# Patient Record
Sex: Female | Born: 1935 | Hispanic: No | Marital: Married
Health system: Southern US, Community
[De-identification: ages and names within clinical notes are randomized; demographics above are authoritative.]

## PROBLEM LIST (undated history)

## (undated) DIAGNOSIS — Z8659 Personal history of other mental and behavioral disorders: Secondary | ICD-10-CM

## (undated) DIAGNOSIS — I10 Essential (primary) hypertension: Secondary | ICD-10-CM

## (undated) DIAGNOSIS — K219 Gastro-esophageal reflux disease without esophagitis: Secondary | ICD-10-CM

## (undated) DIAGNOSIS — E7800 Pure hypercholesterolemia, unspecified: Secondary | ICD-10-CM

## (undated) DIAGNOSIS — I82409 Acute embolism and thrombosis of unspecified deep veins of unspecified lower extremity: Secondary | ICD-10-CM

## (undated) DIAGNOSIS — C50919 Malignant neoplasm of unspecified site of unspecified female breast: Secondary | ICD-10-CM

## (undated) HISTORY — DX: Personal history of other mental and behavioral disorders: Z86.59

## (undated) HISTORY — PX: CHOLECYSTECTOMY: SHX55

## (undated) HISTORY — DX: Essential (primary) hypertension: I10

## (undated) HISTORY — DX: Pure hypercholesterolemia, unspecified: E78.00

## (undated) HISTORY — PX: OTHER SURGICAL HISTORY: SHX169

## (undated) HISTORY — PX: BREAST BIOPSY: SHX20

## (undated) HISTORY — PX: APPENDECTOMY: SHX54

## (undated) HISTORY — DX: Gastro-esophageal reflux disease without esophagitis: K21.9

## (undated) HISTORY — DX: Acute embolism and thrombosis of unspecified deep veins of unspecified lower extremity: I82.409

## (undated) HISTORY — PX: BREAST CYST ASPIRATION: SHX578

---

## 2004-02-27 ENCOUNTER — Ambulatory Visit: Payer: Self-pay | Admitting: Family Medicine

## 2005-04-22 ENCOUNTER — Ambulatory Visit: Payer: Self-pay | Admitting: Family Medicine

## 2005-06-25 ENCOUNTER — Ambulatory Visit: Payer: Self-pay | Admitting: General Surgery

## 2006-05-26 ENCOUNTER — Ambulatory Visit: Payer: Self-pay | Admitting: Family Medicine

## 2007-06-21 ENCOUNTER — Ambulatory Visit: Payer: Self-pay | Admitting: Family Medicine

## 2008-06-21 ENCOUNTER — Ambulatory Visit: Payer: Self-pay | Admitting: Internal Medicine

## 2009-01-13 ENCOUNTER — Ambulatory Visit: Payer: Self-pay | Admitting: Internal Medicine

## 2009-04-19 HISTORY — PX: MASTECTOMY: SHX3

## 2009-06-23 ENCOUNTER — Ambulatory Visit: Payer: Self-pay | Admitting: Internal Medicine

## 2009-07-22 ENCOUNTER — Ambulatory Visit: Payer: Self-pay | Admitting: Surgery

## 2009-07-31 ENCOUNTER — Ambulatory Visit: Payer: Self-pay | Admitting: Surgery

## 2009-08-13 ENCOUNTER — Ambulatory Visit: Payer: Self-pay | Admitting: Internal Medicine

## 2009-08-13 ENCOUNTER — Ambulatory Visit: Payer: Self-pay | Admitting: Cardiology

## 2009-08-19 ENCOUNTER — Ambulatory Visit: Payer: Self-pay | Admitting: Surgery

## 2009-09-17 ENCOUNTER — Ambulatory Visit: Payer: Self-pay | Admitting: Oncology

## 2009-09-17 ENCOUNTER — Ambulatory Visit: Payer: Self-pay | Admitting: Surgery

## 2009-09-23 ENCOUNTER — Inpatient Hospital Stay: Payer: Self-pay | Admitting: Internal Medicine

## 2009-10-15 ENCOUNTER — Ambulatory Visit: Payer: Self-pay | Admitting: Oncology

## 2009-10-17 ENCOUNTER — Ambulatory Visit: Payer: Self-pay | Admitting: Oncology

## 2009-10-21 ENCOUNTER — Ambulatory Visit: Payer: Self-pay | Admitting: Oncology

## 2009-10-23 ENCOUNTER — Ambulatory Visit: Payer: Self-pay | Admitting: Oncology

## 2009-11-03 ENCOUNTER — Ambulatory Visit: Payer: Self-pay | Admitting: Surgery

## 2009-11-06 ENCOUNTER — Ambulatory Visit: Payer: Self-pay | Admitting: Surgery

## 2009-11-11 ENCOUNTER — Ambulatory Visit: Payer: Self-pay | Admitting: Oncology

## 2009-11-15 ENCOUNTER — Other Ambulatory Visit: Payer: Self-pay | Admitting: Internal Medicine

## 2009-11-17 ENCOUNTER — Ambulatory Visit: Payer: Self-pay | Admitting: Oncology

## 2009-12-18 ENCOUNTER — Ambulatory Visit: Payer: Self-pay | Admitting: Oncology

## 2010-01-02 ENCOUNTER — Ambulatory Visit: Payer: Self-pay | Admitting: General Practice

## 2010-01-17 ENCOUNTER — Ambulatory Visit: Payer: Self-pay | Admitting: Oncology

## 2010-02-17 ENCOUNTER — Ambulatory Visit: Payer: Self-pay | Admitting: Oncology

## 2010-03-19 ENCOUNTER — Ambulatory Visit: Payer: Self-pay | Admitting: Oncology

## 2010-04-19 ENCOUNTER — Ambulatory Visit: Payer: Self-pay | Admitting: Oncology

## 2010-05-20 ENCOUNTER — Ambulatory Visit: Payer: Self-pay | Admitting: Oncology

## 2010-06-18 ENCOUNTER — Ambulatory Visit: Payer: Self-pay | Admitting: Oncology

## 2010-07-22 ENCOUNTER — Ambulatory Visit: Payer: Self-pay | Admitting: Oncology

## 2010-08-11 ENCOUNTER — Ambulatory Visit: Payer: Self-pay | Admitting: Internal Medicine

## 2010-08-18 ENCOUNTER — Ambulatory Visit: Payer: Self-pay | Admitting: Oncology

## 2010-09-18 ENCOUNTER — Ambulatory Visit: Payer: Self-pay | Admitting: Oncology

## 2010-10-18 ENCOUNTER — Ambulatory Visit: Payer: Self-pay | Admitting: Oncology

## 2010-11-18 ENCOUNTER — Ambulatory Visit: Payer: Self-pay | Admitting: Oncology

## 2010-12-11 LAB — CANCER ANTIGEN 27.29: CA 27.29: 19.2 U/mL (ref 0.0–38.6)

## 2010-12-19 ENCOUNTER — Ambulatory Visit: Payer: Self-pay

## 2010-12-19 ENCOUNTER — Ambulatory Visit: Payer: Self-pay | Admitting: Oncology

## 2011-02-08 ENCOUNTER — Ambulatory Visit: Payer: Self-pay | Admitting: Oncology

## 2011-02-18 ENCOUNTER — Ambulatory Visit: Payer: Self-pay | Admitting: Oncology

## 2011-03-16 LAB — CANCER ANTIGEN 27.29: CA 27.29: 18.4 U/mL (ref 0.0–38.6)

## 2011-03-20 ENCOUNTER — Ambulatory Visit: Payer: Self-pay | Admitting: Oncology

## 2011-05-19 ENCOUNTER — Ambulatory Visit: Payer: Self-pay | Admitting: Oncology

## 2011-05-21 ENCOUNTER — Ambulatory Visit: Payer: Self-pay | Admitting: Oncology

## 2011-06-18 ENCOUNTER — Ambulatory Visit: Payer: Self-pay | Admitting: Oncology

## 2011-09-14 ENCOUNTER — Ambulatory Visit: Payer: Self-pay | Admitting: Oncology

## 2011-09-15 ENCOUNTER — Ambulatory Visit: Payer: Self-pay | Admitting: Oncology

## 2011-09-18 ENCOUNTER — Ambulatory Visit: Payer: Self-pay | Admitting: Oncology

## 2011-10-18 ENCOUNTER — Ambulatory Visit: Payer: Self-pay | Admitting: Oncology

## 2011-12-16 ENCOUNTER — Ambulatory Visit: Payer: Self-pay | Admitting: Oncology

## 2011-12-19 ENCOUNTER — Ambulatory Visit: Payer: Self-pay | Admitting: Oncology

## 2012-04-19 ENCOUNTER — Ambulatory Visit: Payer: Self-pay | Admitting: Radiation Oncology

## 2012-05-20 ENCOUNTER — Ambulatory Visit: Payer: Self-pay | Admitting: Radiation Oncology

## 2012-07-12 ENCOUNTER — Ambulatory Visit: Payer: Self-pay | Admitting: Oncology

## 2012-07-18 ENCOUNTER — Ambulatory Visit: Payer: Self-pay | Admitting: Oncology

## 2012-09-18 ENCOUNTER — Ambulatory Visit: Payer: Self-pay | Admitting: Internal Medicine

## 2012-10-02 ENCOUNTER — Ambulatory Visit: Payer: Self-pay | Admitting: Oncology

## 2013-01-17 ENCOUNTER — Ambulatory Visit: Payer: Self-pay | Admitting: Oncology

## 2013-01-18 LAB — CANCER ANTIGEN 27.29: CA 27.29: 26.3 U/mL (ref 0.0–38.6)

## 2013-01-24 ENCOUNTER — Ambulatory Visit: Payer: Self-pay | Admitting: Ophthalmology

## 2013-02-17 ENCOUNTER — Ambulatory Visit: Payer: Self-pay | Admitting: Oncology

## 2013-02-28 ENCOUNTER — Ambulatory Visit: Payer: Self-pay | Admitting: Ophthalmology

## 2013-07-17 ENCOUNTER — Ambulatory Visit: Payer: Self-pay | Admitting: Oncology

## 2013-08-15 ENCOUNTER — Ambulatory Visit: Payer: Self-pay | Admitting: Oncology

## 2013-08-16 LAB — CANCER ANTIGEN 27.29: CA 27.29: 31.5 U/mL (ref 0.0–38.6)

## 2013-08-17 ENCOUNTER — Ambulatory Visit: Payer: Self-pay | Admitting: Oncology

## 2013-09-19 ENCOUNTER — Ambulatory Visit: Payer: Self-pay | Admitting: Oncology

## 2014-02-13 ENCOUNTER — Ambulatory Visit: Payer: Self-pay | Admitting: Oncology

## 2014-02-14 LAB — CANCER ANTIGEN 27.29: CA 27.29: 51.9 U/mL — AB (ref 0.0–38.6)

## 2014-02-17 ENCOUNTER — Ambulatory Visit: Payer: Self-pay | Admitting: Oncology

## 2014-03-27 ENCOUNTER — Ambulatory Visit: Payer: Self-pay | Admitting: Oncology

## 2014-03-28 LAB — CANCER ANTIGEN 27.29: CA 27.29: 55.3 U/mL — AB (ref 0.0–38.6)

## 2014-04-19 ENCOUNTER — Ambulatory Visit: Payer: Self-pay | Admitting: Oncology

## 2014-08-09 ENCOUNTER — Ambulatory Visit
Admit: 2014-08-09 | Disposition: A | Discharge status: Home / Self Care | Payer: Self-pay | Attending: Oncology | Admitting: Oncology

## 2014-08-10 LAB — CANCER ANTIGEN 27.29: CA 27.29: 82.9 U/mL — ABNORMAL HIGH (ref 0.0–38.6)

## 2014-09-06 ENCOUNTER — Other Ambulatory Visit: Payer: Self-pay | Admitting: Oncology

## 2014-09-06 DIAGNOSIS — C50911 Malignant neoplasm of unspecified site of right female breast: Secondary | ICD-10-CM

## 2014-09-06 DIAGNOSIS — C50919 Malignant neoplasm of unspecified site of unspecified female breast: Secondary | ICD-10-CM | POA: Insufficient documentation

## 2014-11-15 ENCOUNTER — Other Ambulatory Visit: Payer: Self-pay | Admitting: Oncology

## 2014-11-15 DIAGNOSIS — C50911 Malignant neoplasm of unspecified site of right female breast: Secondary | ICD-10-CM

## 2014-11-18 ENCOUNTER — Telehealth: Payer: Self-pay | Admitting: Oncology

## 2014-11-18 ENCOUNTER — Other Ambulatory Visit: Payer: Self-pay | Admitting: *Deleted

## 2014-11-18 DIAGNOSIS — C50911 Malignant neoplasm of unspecified site of right female breast: Secondary | ICD-10-CM

## 2014-11-18 NOTE — Telephone Encounter (Signed)
Order entered for screening mammogram

## 2014-11-18 NOTE — Telephone Encounter (Signed)
Patient called to get mammogram appt. When I contacted [facility] to schedule, they said the order needs to be changed to screening only and the ultrasound order needs to be cancelled. Please change order and let me know when I can call [facility] to schedule. Thanks.

## 2014-11-26 ENCOUNTER — Ambulatory Visit
Admission: RE | Admit: 2014-11-26 | Discharge: 2014-11-26 | Disposition: A | Discharge status: Home / Self Care | Payer: Medicare Other | Source: Ambulatory Visit | Attending: Oncology | Admitting: Oncology

## 2014-11-26 DIAGNOSIS — Z1231 Encounter for screening mammogram for malignant neoplasm of breast: Secondary | ICD-10-CM | POA: Diagnosis present

## 2014-11-26 DIAGNOSIS — Z853 Personal history of malignant neoplasm of breast: Secondary | ICD-10-CM | POA: Diagnosis present

## 2014-11-26 DIAGNOSIS — C50911 Malignant neoplasm of unspecified site of right female breast: Secondary | ICD-10-CM

## 2014-11-26 DIAGNOSIS — M8580 Other specified disorders of bone density and structure, unspecified site: Secondary | ICD-10-CM | POA: Diagnosis not present

## 2014-11-26 HISTORY — DX: Malignant neoplasm of unspecified site of unspecified female breast: C50.919

## 2014-12-12 ENCOUNTER — Other Ambulatory Visit: Payer: Self-pay | Admitting: Ophthalmology

## 2014-12-12 DIAGNOSIS — H532 Diplopia: Secondary | ICD-10-CM

## 2014-12-16 ENCOUNTER — Ambulatory Visit
Admission: RE | Admit: 2014-12-16 | Discharge: 2014-12-16 | Disposition: A | Discharge status: Home / Self Care | Payer: Medicare Other | Source: Ambulatory Visit | Attending: Ophthalmology | Admitting: Ophthalmology

## 2014-12-16 DIAGNOSIS — H532 Diplopia: Secondary | ICD-10-CM | POA: Insufficient documentation

## 2014-12-16 DIAGNOSIS — C50919 Malignant neoplasm of unspecified site of unspecified female breast: Secondary | ICD-10-CM | POA: Diagnosis not present

## 2014-12-16 MED ORDER — GADOBENATE DIMEGLUMINE 529 MG/ML IV SOLN
15.0000 mL | Freq: Once | INTRAVENOUS | Status: AC | PRN
  Administered 2014-12-16: 13 mL via INTRAVENOUS

## 2014-12-17 ENCOUNTER — Inpatient Hospital Stay: Payer: Medicare Other | Attending: Oncology | Admitting: Oncology

## 2014-12-17 ENCOUNTER — Encounter: Payer: Self-pay | Admitting: Oncology

## 2014-12-17 ENCOUNTER — Ambulatory Visit
Admission: RE | Admit: 2014-12-17 | Discharge: 2014-12-17 | Disposition: A | Discharge status: Home / Self Care | Payer: Medicare Other | Source: Ambulatory Visit | Attending: Radiation Oncology | Admitting: Radiation Oncology

## 2014-12-17 ENCOUNTER — Telehealth: Payer: Self-pay | Admitting: *Deleted

## 2014-12-17 VITALS — BP 153/69 | HR 65 | Temp 97.4°F | Resp 18 | Wt 132.5 lb

## 2014-12-17 DIAGNOSIS — C7951 Secondary malignant neoplasm of bone: Secondary | ICD-10-CM | POA: Diagnosis not present

## 2014-12-17 DIAGNOSIS — R918 Other nonspecific abnormal finding of lung field: Secondary | ICD-10-CM | POA: Diagnosis not present

## 2014-12-17 DIAGNOSIS — K219 Gastro-esophageal reflux disease without esophagitis: Secondary | ICD-10-CM | POA: Diagnosis not present

## 2014-12-17 DIAGNOSIS — H538 Other visual disturbances: Secondary | ICD-10-CM | POA: Diagnosis not present

## 2014-12-17 DIAGNOSIS — Z170 Estrogen receptor positive status [ER+]: Secondary | ICD-10-CM | POA: Insufficient documentation

## 2014-12-17 DIAGNOSIS — C50919 Malignant neoplasm of unspecified site of unspecified female breast: Secondary | ICD-10-CM | POA: Diagnosis not present

## 2014-12-17 DIAGNOSIS — C50911 Malignant neoplasm of unspecified site of right female breast: Secondary | ICD-10-CM | POA: Insufficient documentation

## 2014-12-17 DIAGNOSIS — E780 Pure hypercholesterolemia: Secondary | ICD-10-CM | POA: Diagnosis not present

## 2014-12-17 DIAGNOSIS — C7931 Secondary malignant neoplasm of brain: Secondary | ICD-10-CM | POA: Insufficient documentation

## 2014-12-17 DIAGNOSIS — Z803 Family history of malignant neoplasm of breast: Secondary | ICD-10-CM | POA: Insufficient documentation

## 2014-12-17 DIAGNOSIS — F418 Other specified anxiety disorders: Secondary | ICD-10-CM | POA: Insufficient documentation

## 2014-12-17 DIAGNOSIS — H532 Diplopia: Secondary | ICD-10-CM | POA: Diagnosis not present

## 2014-12-17 DIAGNOSIS — G939 Disorder of brain, unspecified: Secondary | ICD-10-CM | POA: Insufficient documentation

## 2014-12-17 DIAGNOSIS — Z79899 Other long term (current) drug therapy: Secondary | ICD-10-CM | POA: Insufficient documentation

## 2014-12-17 DIAGNOSIS — R2689 Other abnormalities of gait and mobility: Secondary | ICD-10-CM | POA: Insufficient documentation

## 2014-12-17 DIAGNOSIS — Z86718 Personal history of other venous thrombosis and embolism: Secondary | ICD-10-CM | POA: Insufficient documentation

## 2014-12-17 DIAGNOSIS — I10 Essential (primary) hypertension: Secondary | ICD-10-CM | POA: Insufficient documentation

## 2014-12-17 DIAGNOSIS — Z9011 Acquired absence of right breast and nipple: Secondary | ICD-10-CM | POA: Diagnosis not present

## 2014-12-17 DIAGNOSIS — Z79811 Long term (current) use of aromatase inhibitors: Secondary | ICD-10-CM | POA: Diagnosis not present

## 2014-12-17 DIAGNOSIS — Z510 Encounter for antineoplastic radiation therapy: Secondary | ICD-10-CM | POA: Insufficient documentation

## 2014-12-17 MED ORDER — DEXAMETHASONE 4 MG PO TABS: 8.0000 mg | ORAL_TABLET | Freq: Two times a day (BID) | ORAL | Status: DC

## 2014-12-17 NOTE — Consult Note (Signed)
Except an outstanding is perfect of Radiation Oncology NEW PATIENT EVALUATION  Name: Breanna Curtis  MRN: [id number]  Date:   12/17/2014     DOB: 07-19-1933   This 81 y.o. female patient presents to the clinic for initial evaluation of brain metastases.  REFERRING PHYSICIAN: Kirk Ruths, MD  CHIEF COMPLAINT: No chief complaint on file.   DIAGNOSIS: The encounter diagnosis was Metastatic cancer to brain.   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 She received adjuvant chest wall and peripheral lymphatic radiation. Tumor was borderline ER positive PR positive and HER-2/neu negative. She recently was seen by ophthalmology for diplopia and MRI scan was recommended based on possible extrinsic compression on the posterior orbits. MRI scan showed findings consistent with widespread metastatic disease involving both the calvarium as well as the brain. There is also infiltration of tumor into the orbital musculature compressing the optic nerves bilaterally. This was all compatible with breast cancer metastatic to the brain. She is seen today for evaluation and consideration of whole brain radiation. She does have some periorbital swelling. She's also having some slight dizziness although is able to ambulate fairly well.  PLANNED TREATMENT REGIMEN: Palliative whole brain radiation  PAST MEDICAL HISTORY:  has a past medical history of Breast cancer.    PAST SURGICAL HISTORY:  Past Surgical History  Procedure Laterality Date  . Mastectomy Right 2011    rt mastectomy-chemo/rad  . Breast cyst aspiration Left     lt fna-neg  . Breast biopsy Left     neg    FAMILY HISTORY:  family history includes Breast cancer (age of onset: 15) in her maternal aunt.  SOCIAL HISTORY:    ALLERGIES: Review of patient's allergies indicates not on file.  MEDICATIONS:  No current outpatient prescriptions on file.   No current facility-administered medications for this encounter.    ECOG PERFORMANCE STATUS:  1 - Symptomatic but completely ambulatory  REVIEW OF SYSTEMS: Except for the dizziness, diplopia and periorbital swelling Patient denies any weight loss, fatigue, weakness, fever, chills or night sweats. Patient denies any loss of vision, blurred vision. Patient denies any ringing  of the ears or hearing loss. No irregular heartbeat. Patient denies heart murmur or history of fainting. Patient denies any chest pain or pain radiating to her upper extremities. Patient denies any shortness of breath, difficulty breathing at night, cough or hemoptysis. Patient denies any swelling in the lower legs. Patient denies any nausea vomiting, vomiting of blood, or coffee ground material in the vomitus. Patient denies any stomach pain. Patient states has had normal bowel movements no significant constipation or diarrhea. Patient denies any dysuria, hematuria or significant nocturia. Patient denies any problems walking, swelling in the joints or loss of balance. Patient denies any skin changes, loss of hair or loss of weight. Patient denies any excessive worrying or anxiety or significant depression. Patient denies any problems with insomnia. Patient denies excessive thirst, polyuria, polydipsia. Patient denies any swollen glands, patient denies easy bruising or easy bleeding. Patient denies any recent infections, allergies or URI. Patient "s visual fields have not changed significantly in recent time.    PHYSICAL EXAM: There were no vitals taken for this visit. Well-developed elderly female in NAD.  Crude visual fields are within normal range. Extraocular motion is intact. No focal motor sensory or  DTR levels. Cranial nerves II through XII appear grossly intact. She is status post right modified radical mastectomy chest walls free of any evidence of recurrent disease. Well-developed well-nourished patient in NAD. HEENT reveals PERLA, EOMI, discs not visualized.  Oral cavity is clear. No oral mucosal lesions are identified. Neck is clear without evidence of cervical or supraclavicular adenopathy. Lungs are clear to A&P. Cardiac examination is essentially unremarkable with regular rate and rhythm without murmur rub or thrill. Abdomen is benign with no organomegaly or masses noted. Motor sensory and DTR levels are equal and symmetric in the upper and lower extremities. Cranial nerves II through XII are grossly intact. Proprioception is intact. No peripheral adenopathy or edema is identified. No motor or sensory levels are noted. Crude visual fields are within normal range.   LABORATORY DATA: No current pathology reports reviewed    RADIOLOGY RESULTS: MRI scans are reviewed  IMPRESSION: Stage IV breast cancer with metastatic disease to the brain including posterior orbits bilaterally.  PLAN: At this time I to go ahead with whole brain radiation therapy. Would plan on delivering 3000 cGy in 10 fractions. I discussed the risks and benefits of treatment including possible cognitive decline, fatigue, alteration of blood counts, loss of hair and irritation of the scalp, patient and her daughter both seem to comprehend my treatment plan well. I've set up and ordered CT simulation for tomorrow. I've also asked medical oncology to place her on styloids. Also would probably do a bone scan or other scans to complete her metastatic workup. I discussed the case personally with medical oncology.  I would like to take this opportunity for allowing me to participate in the care of your patient.Armstead Peaks., MD

## 2014-12-17 NOTE — Telephone Encounter (Signed)
Went to eye doc who ordered MRI of eyes and she has masses pushing on her optic nerves, Asking if her appt can be moved up. Appt for today at [facility] given

## 2014-12-17 NOTE — Progress Notes (Signed)
Patient was seen by opthalmology for diplopia and received a brain MRI that showed brain mets.

## 2014-12-18 ENCOUNTER — Ambulatory Visit
Admission: RE | Admit: 2014-12-18 | Discharge: 2014-12-18 | Disposition: A | Discharge status: Home / Self Care | Payer: Medicare Other | Source: Ambulatory Visit | Attending: Radiation Oncology | Admitting: Radiation Oncology

## 2014-12-18 DIAGNOSIS — Z510 Encounter for antineoplastic radiation therapy: Secondary | ICD-10-CM | POA: Diagnosis present

## 2014-12-18 DIAGNOSIS — C50919 Malignant neoplasm of unspecified site of unspecified female breast: Secondary | ICD-10-CM | POA: Diagnosis not present

## 2014-12-18 DIAGNOSIS — Z170 Estrogen receptor positive status [ER+]: Secondary | ICD-10-CM | POA: Diagnosis not present

## 2014-12-18 DIAGNOSIS — C7931 Secondary malignant neoplasm of brain: Secondary | ICD-10-CM | POA: Diagnosis not present

## 2014-12-19 ENCOUNTER — Ambulatory Visit
Admission: RE | Admit: 2014-12-19 | Discharge: 2014-12-19 | Disposition: A | Discharge status: Home / Self Care | Payer: Medicare Other | Source: Ambulatory Visit | Attending: Oncology | Admitting: Oncology

## 2014-12-19 DIAGNOSIS — C50919 Malignant neoplasm of unspecified site of unspecified female breast: Secondary | ICD-10-CM | POA: Diagnosis present

## 2014-12-19 DIAGNOSIS — C7931 Secondary malignant neoplasm of brain: Secondary | ICD-10-CM | POA: Diagnosis present

## 2014-12-19 MED ORDER — TECHNETIUM TC 99M MEDRONATE IV KIT
25.0000 | PACK | Freq: Once | INTRAVENOUS | Status: AC | PRN
  Administered 2014-12-19: 23.26 via INTRAVENOUS

## 2014-12-20 ENCOUNTER — Other Ambulatory Visit: Payer: Self-pay

## 2014-12-20 ENCOUNTER — Ambulatory Visit: Payer: Self-pay | Admitting: Oncology

## 2014-12-20 ENCOUNTER — Ambulatory Visit
Admission: RE | Admit: 2014-12-20 | Discharge: 2014-12-20 | Disposition: A | Discharge status: Home / Self Care | Payer: Medicare Other | Source: Ambulatory Visit | Attending: Oncology | Admitting: Oncology

## 2014-12-20 DIAGNOSIS — C50919 Malignant neoplasm of unspecified site of unspecified female breast: Secondary | ICD-10-CM | POA: Insufficient documentation

## 2014-12-20 DIAGNOSIS — I517 Cardiomegaly: Secondary | ICD-10-CM | POA: Insufficient documentation

## 2014-12-20 DIAGNOSIS — C7931 Secondary malignant neoplasm of brain: Secondary | ICD-10-CM | POA: Diagnosis present

## 2014-12-20 DIAGNOSIS — C7951 Secondary malignant neoplasm of bone: Secondary | ICD-10-CM | POA: Insufficient documentation

## 2014-12-20 DIAGNOSIS — X58XXXA Exposure to other specified factors, initial encounter: Secondary | ICD-10-CM | POA: Diagnosis not present

## 2014-12-20 DIAGNOSIS — M4856XA Collapsed vertebra, not elsewhere classified, lumbar region, initial encounter for fracture: Secondary | ICD-10-CM | POA: Diagnosis not present

## 2014-12-20 MED ORDER — IOHEXOL 300 MG/ML  SOLN
100.0000 mL | Freq: Once | INTRAMUSCULAR | Status: AC | PRN
  Administered 2014-12-20: 100 mL via INTRAVENOUS

## 2014-12-22 NOTE — Progress Notes (Signed)
[place]  [phone] [phone]  ID: Breanna Curtis OB: 13-Feb-1934  MR#: [id number]  UUV#:[id number]  Patient Care Team: Kirk Ruths, MD as PCP - General (Internal Medicine)  CHIEF COMPLAINT:  Chief Complaint  Patient presents with  . Acute Visit    new brain mets seen on MRI    INTERVAL HISTORY: Patient returns to clinic today as an add-on after MRI of her brain revealed lesions highly suspicious for metastasis. She was last seen in April 2016. Patient states soon after her visit she started developing visual changes and double vision. She did not seek medical attention until several weeks ago at which point an MRI of her brain was ordered and found to have a lesion suspicious for metastatic disease. She otherwise feels well and is asymptomatic. She denies any recent fevers. She has good appetite and denies weight loss. She denies any pain. She has no chest pain or shortness of breath. She denies any nausea, vomiting, constipation, or diarrhea. She has no urinary complaints. Patient otherwise feels well and offers no further specific complaints.   REVIEW OF SYSTEMS:   Review of Systems  Constitutional: Negative for fever, weight loss and malaise/fatigue.  Eyes: Positive for blurred vision and double vision.  Respiratory: Negative.   Cardiovascular: Negative.   Musculoskeletal: Negative.   Neurological: Negative.  Negative for weakness and headaches.    As per HPI. Otherwise, a complete review of systems is negatve.  PAST MEDICAL HISTORY: Past Medical History  Diagnosis Date  . Hypertension   . Hypercholesterolemia   . History of depression   . History of anxiety   . GERD (gastroesophageal reflux disease)   . DVT (deep venous thrombosis)   . Breast cancer     right breast ca-2011    PAST SURGICAL HISTORY: Past Surgical History  Procedure Laterality Date  . Mastectomy Right 2011    rt mastectomy-chemo/rad  . Breast  cyst aspiration Left     lt fna-neg  . Breast biopsy Left     neg  . Cholecystectomy    . Appendectomy    . Cyst removed from rectum      FAMILY HISTORY Family History  Problem Relation Age of Onset  . Breast cancer Maternal Aunt 51  . Hypertension Mother   . Hyperlipidemia Mother   . Heart failure Mother   . Arthritis Mother   . Hypertension Sister   . Hyperlipidemia Sister   . COPD Sister        ADVANCED DIRECTIVES:    HEALTH MAINTENANCE: Social History  Substance Use Topics  . Smoking status: Not on file  . Smokeless tobacco: Not on file  . Alcohol Use: Not on file     Colonoscopy:  PAP:  Bone density:  Lipid panel:  No Known Allergies  Current Outpatient Prescriptions  Medication Sig Dispense Refill  . letrozole (FEMARA) 2.5 MG tablet Take by mouth.    . losartan-hydrochlorothiazide (HYZAAR) 100-25 MG per tablet Take by mouth.    . Multiple Vitamin (MULTIVITAMIN) capsule Take by mouth.    Marland Kitchen omeprazole (PRILOSEC) 20 MG capsule Take by mouth.    . sertraline (ZOLOFT) 50 MG tablet Take by mouth.    . simvastatin (ZOCOR) 20 MG tablet Take by mouth.    . traZODone (DESYREL) 50 MG tablet Take by mouth.    . dexamethasone (DECADRON) 4 MG tablet Take 2 tablets (8 mg total) by mouth 2 (two) times daily. 60 tablet 0  No current facility-administered medications for this visit.    OBJECTIVE: Filed Vitals:   12/17/14 1624  BP: 153/69  Pulse: 65  Temp: 97.4 F (36.3 C)  Resp: 18     There is no height on file to calculate BMI.    ECOG FS:0 - Asymptomatic  General: Well-developed, well-nourished, no acute distress. Eyes: Pink conjunctiva, anicteric sclera. Breasts: Right mastectomy. Exam deferred today. Lungs: Clear to auscultation bilaterally. Heart: Regular rate and rhythm. No rubs, murmurs, or gallops. Abdomen: Soft, nontender, nondistended. No organomegaly noted, normoactive bowel sounds. Musculoskeletal: No edema, cyanosis, or clubbing. Neuro:  Alert, answering all questions appropriately. Cranial nerves grossly intact. Skin: No rashes or petechiae noted. Psych: Normal affect.   LAB RESULTS:  No results found for: NA, K, CL, CO2, GLUCOSE, BUN, CREATININE, CALCIUM, PROT, ALBUMIN, AST, ALT, ALKPHOS, BILITOT, GFRNONAA, GFRAA  No results found for: WBC, NEUTROABS, HGB, HCT, MCV, PLT   STUDIES: Ct Chest W Contrast  12/20/2014   CLINICAL DATA:  Right breast cancer. Prior mastectomy, chemotherapy, and radiation therapy in 2011. Restaging/surveillance. Preprocedural for radiation therapy next week for brain metastatic disease. Osseous metastatic disease on bone scan yesterday.  EXAM: CT CHEST, ABDOMEN, AND PELVIS WITH CONTRAST  TECHNIQUE: Multidetector CT imaging of the chest, abdomen and pelvis was performed following the standard protocol during bolus administration of intravenous contrast.  CONTRAST:  158mL OMNIPAQUE IOHEXOL 300 MG/ML  SOLN  COMPARISON:  Multiple exams, including 12/19/2014 and 12/17/2010  FINDINGS: CT CHEST FINDINGS  Mediastinum/Nodes: Hypodense 9 mm left thyroid nodule.  Moderate cardiomegaly. Distal esophageal wall thickening, mild. Small to moderate-sized hiatal hernia.  Small mediastinal lymph nodes are not pathologically enlarged. Right mastectomy. Right subpectoral node 5 mm in short axis on image 13 series 2, previously 3 mm.  Lungs/Pleura: Trace right pleural effusion. Mild infiltration of the right anterior pericardial adipose tissue, image 37 series 2. Pleural thickening anteriorly adjacent to the right middle lobe, images 37-39 series 2.  Mild scarring in the left lower lobe. 3 mm left upper lobe nodule, image 14 series 4, no change from 2012, considered benign.  Musculoskeletal: Severe bony demineralization. Healing right posterolateral ninth rib fracture, image 44 series 4. Healing fracture of the left sixth rib the rib on image 30 series 4, possibly pathologic. Extensive thoracic spondylosis and demineralization.  Lucency in the base of the T11 spinous process, image 87 series 6. Lytic lesion in the right posterior vertebral body and pedicle at T10, image 80 series 6.  CT ABDOMEN PELVIS FINDINGS  Hepatobiliary: Chronic pneumobilia. Lateral segment left hepatic lobe cyst, image 45 series 2, chronic. Cholecystectomy. Equivocally nodular liver contour.  Pancreas: Unremarkable  Spleen: 2.0 by 1.0 cm hypodense lesion medially in the dome of the spleen, image 38 series 2.  Adrenals/Urinary Tract: Bilateral hydronephrosis with distended collecting systems. Mild proximal hydroureter with more striking left distal hydroureter. Poor definition of the ureters distally. Wall thickening of the urinary bladder.  Stomach/Bowel: Wall thickening in the stomach antrum may be due to contraction but is technically nonspecific. Mild prominence of stool in the colon. Prominent wall thickening in the rectum extending from the rectosigmoid junction to the lower rectum.  Vascular/Lymphatic: Mild aortoiliac atherosclerosis. Venous varicosities along the lower abdominal wall and subcutaneous tissues superficial to the pubis. Small external iliac lymph nodes are not pathologically enlarged.  Reproductive: Amorphous soft tissue densities are present along both adnexa. These are enlarged compared to 10/23/2009 and concerning for tumor deposits. Considerable indistinctness of surrounding tissue planes, tracking along  the pelvic side walls. Calcifications in the uterus likely from prior fibroids.  Other: Moderate scattered ascites. Fluid infiltration of the omentum with some mutlifocality/irregularity. Fluid infiltration of the mesentery. Tumor infiltration of the mesentery and omentum is a distinct possibility. Presacral edema noted.  Musculoskeletal: Bony demineralization. This may obscure metastatic lesions. Superior endplate compression fracture at L5 with grade 1 degenerative anterolisthesis at L5-S1. Vertical lucency along the right side of the L4  vertebral body on images 78-79 of series 6 could be from a nondisplaced fracture or simply an irregular spur. Old superior endplate compression fracture at L3. Multilevel degenerative disc disease in the lumbar spine.  IMPRESSION: 1. Marked wall thickening in portions of the rectum such that rectal tumor is not excluded. Increase in soft tissue density along the adnexa/ovaries compared to prior, metastatic deposits in this vicinity are not excluded. There is retroperitoneal, pelvic sidewall, mesenteric, and omental edema with enhancing margins of the ureters, and likely some degree of distal ureteral obstruction causing the bilateral hydronephrosis and ureteral dilatation and enhancement. Peritoneal spread of malignancy is not excluded given this constellation of findings. 2. Osseous metastatic disease to the T10 vertebra, potentially the T11 spinous process, and ribs. 3. Superior endplate compression fracture at L5. Possible nondisplaced right-sided cortical fracture of the L4 vertebral body. 4. Equivocally nodular liver contour, query cirrhosis. 5. Additional findings at individual levels are as follows: Cardiomegaly. Distal esophageal wall thickening, query esophagitis. Small to moderate hiatal hernia. Trace right pleural effusion with some anterior right pleural irregularity. Prominent bony demineralization. Several healing rib fractures, 1 of which may be pathologic. Chronic pneumobilia. 2.0 by 1.0 cm nonspecific hypodense lesion medially in the dome of the spleen, merits observation. Atherosclerosis. Venous varicosities along the lower abdominal wall subcutaneous tissues. Multilevel degenerative disc disease.   Electronically Signed   By: Van Clines M.D.   On: 12/20/2014 16:31   Mr Jeri Cos JJ Contrast  12/16/2014   CLINICAL DATA:  Headaches, double vision, balance issues. Symptoms for 1-2 months. Breast cancer.  EXAM: MRI HEAD AND ORBITS WITHOUT AND WITH CONTRAST  TECHNIQUE: Multiplanar, multiecho  pulse sequences of the brain and surrounding structures were obtained without and with intravenous contrast. Multiplanar, multiecho pulse sequences of the orbits and surrounding structures were obtained including fat saturation techniques, before and after intravenous contrast administration.  CONTRAST:  73mL MULTIHANCE GADOBENATE DIMEGLUMINE 529 MG/ML IV SOLN  COMPARISON:  None.  FINDINGS: MRI HEAD FINDINGS  The patient was unable to remain motionless for the exam. Small or subtle lesions could be overlooked.  No acute stroke, acute hemorrhage, mass lesion, hydrocephalus, or extra-axial fluid. Mild atrophy. Moderate T2 and FLAIR hyperintensity throughout the white matter, likely small vessel disease. No chronic hemorrhage. Flow voids are maintained. Post infusion, there is no abnormal intra-axial enhancement. However, there is osseous metastatic disease to the LEFT frontal bone, with a large deposit measuring 15 x 23 x 20 mm with adjacent dural enhancement. No leptomeningeal enhancement is evident. No visible frontal vasogenic edema. Additional tiny enhancing lesions throughout other areas of the calvarium are also observed. No skull base disease but mild pannus is noted.  MRI ORBITS FINDINGS  Infiltrative, avidly enhancing masses occupy the extraconal and intraconal spaces of the orbits, inseparable from the orbital musculature anteriorly, surrounding the optic nerves, compressing it on the LEFT, extending into the superficial soft tissues of the face greater on the RIGHT. No ocular enhancement. Slight enophthalmos may be present on the LEFT, versus exophthalmos on the RIGHT.No cavernous  sinus extension. No paranasal sinus disease. No definite subfrontal intracranial extension.  IMPRESSION: Constellation of MR head and MR orbits findings consistent with widespread metastatic disease from breast cancer. Avidly enhancing and infiltrative neoplastic disease is inseparable from the orbital musculature, is  multicompartmental, and compresses the the LEFT greater than RIGHT optic nerves. No cavernous sinus extension.  Infiltrative orbital enhancement similar to this can be seen in lymphoproliferative disorders, but is less favored, given the osseous metastatic disease to the calvarium, as well as the personal history of breast cancer. Tissue sampling is warranted.  No intra-axial brain metastases are observed. Mild atrophy with chronic microvascular ischemic changes noted.  Findings discussed with ordering provider at time of interpretation.   Electronically Signed   By: Staci Righter M.D.   On: 12/16/2014 13:56   Nm Bone Scan Whole Body  12/19/2014   CLINICAL DATA:  History of right breast malignancy, asymptomatic.  EXAM: NUCLEAR MEDICINE WHOLE BODY BONE SCAN  TECHNIQUE: Whole body anterior and posterior images were obtained approximately 3 hours after intravenous injection of radiopharmaceutical.  RADIOPHARMACEUTICALS:  23.26 mCi Technetium-86m MDP IV  COMPARISON:  MRI of the head of December 16, 2014  FINDINGS: There is adequate uptake of the radiopharmaceutical by the skeleton. There is intensely increased uptake within both kidneys. There is dilation of the midportion of the left ureter. The right ureter is not significantly dilated. Bladder activity is visible.  There are patchy areas of increased uptake in the posterior parietal bone on the right and in the left frontal bone. A focus of increased uptake in the midline at the basiocciput is visible.  There is mildly increased uptake in the lateral masses of the cervical spine. There is increased uptake within approximately T4 and T5 an approximately T9 or T10. There is mildly increased uptake at approximately L3 and at L5-S1.  There are multiple areas of increased uptake within posterior ribs bilaterally.  There is intensely increased uptake in the first carpometacarpal joint on the right.  Uptake within the left acetabulum is mildly increased. Activity elsewhere  in the pelvis is unremarkable. Increased uptake about the knees and in the ankles and feet is likely degenerative in nature.  IMPRESSION: 1. Radiopharmaceutical uptake pattern consistent with metastatic disease to the calvarium, thoracic and lumbar spine, bilateral ribs, and possibly left acetabulum. 2. Retention of the radiopharmaceutical within both kidneys possibly related to obstruction given the appearance of the dilated left ureter and mild prominence of the proximal right ureter. Abdominal and pelvic CT scanning is recommended.   Electronically Signed   By: David  Martinique M.D.   On: 12/19/2014 14:55   Ct Abdomen Pelvis W Contrast  12/20/2014   CLINICAL DATA:  Right breast cancer. Prior mastectomy, chemotherapy, and radiation therapy in 2011. Restaging/surveillance. Preprocedural for radiation therapy next week for brain metastatic disease. Osseous metastatic disease on bone scan yesterday.  EXAM: CT CHEST, ABDOMEN, AND PELVIS WITH CONTRAST  TECHNIQUE: Multidetector CT imaging of the chest, abdomen and pelvis was performed following the standard protocol during bolus administration of intravenous contrast.  CONTRAST:  167mL OMNIPAQUE IOHEXOL 300 MG/ML  SOLN  COMPARISON:  Multiple exams, including 12/19/2014 and 12/17/2010  FINDINGS: CT CHEST FINDINGS  Mediastinum/Nodes: Hypodense 9 mm left thyroid nodule.  Moderate cardiomegaly. Distal esophageal wall thickening, mild. Small to moderate-sized hiatal hernia.  Small mediastinal lymph nodes are not pathologically enlarged. Right mastectomy. Right subpectoral node 5 mm in short axis on image 13 series 2, previously 3 mm.  Lungs/Pleura: Trace  right pleural effusion. Mild infiltration of the right anterior pericardial adipose tissue, image 37 series 2. Pleural thickening anteriorly adjacent to the right middle lobe, images 37-39 series 2.  Mild scarring in the left lower lobe. 3 mm left upper lobe nodule, image 14 series 4, no change from 2012, considered benign.   Musculoskeletal: Severe bony demineralization. Healing right posterolateral ninth rib fracture, image 44 series 4. Healing fracture of the left sixth rib the rib on image 30 series 4, possibly pathologic. Extensive thoracic spondylosis and demineralization. Lucency in the base of the T11 spinous process, image 87 series 6. Lytic lesion in the right posterior vertebral body and pedicle at T10, image 80 series 6.  CT ABDOMEN PELVIS FINDINGS  Hepatobiliary: Chronic pneumobilia. Lateral segment left hepatic lobe cyst, image 45 series 2, chronic. Cholecystectomy. Equivocally nodular liver contour.  Pancreas: Unremarkable  Spleen: 2.0 by 1.0 cm hypodense lesion medially in the dome of the spleen, image 38 series 2.  Adrenals/Urinary Tract: Bilateral hydronephrosis with distended collecting systems. Mild proximal hydroureter with more striking left distal hydroureter. Poor definition of the ureters distally. Wall thickening of the urinary bladder.  Stomach/Bowel: Wall thickening in the stomach antrum may be due to contraction but is technically nonspecific. Mild prominence of stool in the colon. Prominent wall thickening in the rectum extending from the rectosigmoid junction to the lower rectum.  Vascular/Lymphatic: Mild aortoiliac atherosclerosis. Venous varicosities along the lower abdominal wall and subcutaneous tissues superficial to the pubis. Small external iliac lymph nodes are not pathologically enlarged.  Reproductive: Amorphous soft tissue densities are present along both adnexa. These are enlarged compared to 10/23/2009 and concerning for tumor deposits. Considerable indistinctness of surrounding tissue planes, tracking along the pelvic side walls. Calcifications in the uterus likely from prior fibroids.  Other: Moderate scattered ascites. Fluid infiltration of the omentum with some mutlifocality/irregularity. Fluid infiltration of the mesentery. Tumor infiltration of the mesentery and omentum is a distinct  possibility. Presacral edema noted.  Musculoskeletal: Bony demineralization. This may obscure metastatic lesions. Superior endplate compression fracture at L5 with grade 1 degenerative anterolisthesis at L5-S1. Vertical lucency along the right side of the L4 vertebral body on images 78-79 of series 6 could be from a nondisplaced fracture or simply an irregular spur. Old superior endplate compression fracture at L3. Multilevel degenerative disc disease in the lumbar spine.  IMPRESSION: 1. Marked wall thickening in portions of the rectum such that rectal tumor is not excluded. Increase in soft tissue density along the adnexa/ovaries compared to prior, metastatic deposits in this vicinity are not excluded. There is retroperitoneal, pelvic sidewall, mesenteric, and omental edema with enhancing margins of the ureters, and likely some degree of distal ureteral obstruction causing the bilateral hydronephrosis and ureteral dilatation and enhancement. Peritoneal spread of malignancy is not excluded given this constellation of findings. 2. Osseous metastatic disease to the T10 vertebra, potentially the T11 spinous process, and ribs. 3. Superior endplate compression fracture at L5. Possible nondisplaced right-sided cortical fracture of the L4 vertebral body. 4. Equivocally nodular liver contour, query cirrhosis. 5. Additional findings at individual levels are as follows: Cardiomegaly. Distal esophageal wall thickening, query esophagitis. Small to moderate hiatal hernia. Trace right pleural effusion with some anterior right pleural irregularity. Prominent bony demineralization. Several healing rib fractures, 1 of which may be pathologic. Chronic pneumobilia. 2.0 by 1.0 cm nonspecific hypodense lesion medially in the dome of the spleen, merits observation. Atherosclerosis. Venous varicosities along the lower abdominal wall subcutaneous tissues. Multilevel degenerative disc disease.  Electronically Signed   By: Van Clines M.D.   On: 12/20/2014 16:31   Dg Bone Density  11/26/2014   EXAM: DUAL X-RAY ABSORPTIOMETRY (DXA) FOR BONE MINERAL DENSITY  IMPRESSION: Dear Lloyd Huger,  Your patient Mi Balla completed a BMD test on 11/26/2014 using the [place] (analysis version: 14.10) manufactured by [employer]. The following summarizes the results of our evaluation.  PATIENT BIOGRAPHICAL: Name: Shantai, Tiedeman Patient ID: [id number] Birth Date: 1933-10-18 Height: 59.0 in. Gender: Female Exam Date: 11/26/2014 Weight: 137.0 lbs. Indications: Advanced Age, Breast ca, Caucasian, Family History of Fracture, high risk meds, Osteoporotic, POSTmenopausal, Family Hist. (Parent hip fracture) Fractures: Treatments: ALADON, femara  ASSESSMENT: The BMD measured at AP Spine L2-L4 is 0.944 g/cm2 with a T-score of -2.1. This patient is considered OSTEOPENIC according to [facility]) criteria.  Site Region Measured Measured WHO Young Adult BMD Date       Age      Classification T-score AP Spine L2-L4 11/26/2014 79.0 Osteopenia -2.1 0.944 g/cm2 AP Spine L2-L4 09/19/2013 77.8 Osteopenia -2.0 0.968 g/cm2  DualFemur Neck Left 11/26/2014 79.0 Osteopenia -1.6 0.818 g/cm2 DualFemur Neck Left 09/19/2013 77.8 Osteopenia -2.0 0.763 g/cm2  World Health Organization [facility]) criteria for post-menopausal, Caucasian Women: Normal:       T-score at or above -1 SD Osteopenia:   T-score between -1 and -2.5 SD Osteoporosis: T-score at or below -2.5 SD  RECOMMENDATIONS: [place] recommends that FDA-approved medical therapies be considered in postemenopausal women and men age 56 or older with a:  1. Hip or vertebral (clinical or morphometric) fracture. 2. T-score of < -2.5at the spine or hip. 3. Ten-year fracture probability by FRAX of 3% or greater for hip fracture or 20% or greater for major osteoporotic fracture.  All treatment decisions require clinical judgment and consideration  of individual patient factors, including patient preferences, co-morbidities, previous drug use, risk factors not captured in the FRAX model (e.g. falls, vitamin D deficiency, increased bone turnover, interval significant decline in bone density) and possible under - or over-estimation of fracture risk by FRAX.  All patients should ensure an adequate intake of dietary calcium (1200 mg/d) and vitamin D (800 IU daily) unless contraindicated.  FOLLOW-UP: People with diagnosed cases of osteoporosis or at high risk for fracture should have regular bone mineral density tests. For patients eligible for Medicare, routine testing is allowed once every 2 years. The testing frequency can be increased to one year for patients who have rapidly progressing disease, those who are receiving or discontinuing medical therapy to restore bone mass, or have additional risk factors.  I have reviewed this report, and agree with the above findings.  Mark A. Thornton Papas, M.D.  Certified Clinical Densitometrist [facility]  Dear Lloyd Huger,  Your patient ESTEEN DELPRIORE completed a FRAX assessment on 11/26/2014 using the [place] (analysis version: 14.10) manufactured by [employer]. The following summarizes the results of our evaluation.  PATIENT BIOGRAPHICAL: Name: Marykathryn, Carboni Patient ID: [id number] Birth Date: Jan 09, 1934 Height:    59.0 in. Gender:     Female    [age]       Weight:    137.0 lbs. Ethnicity:  White                            Exam Date: 11/26/2014  FRAX* RESULTS:  (  version: 3.5) 10-year Probability of Fracture1 Major Osteoporotic Fracture2 Hip Fracture 23.4% 13.3% Population: [place] (Caucasian) Risk Factors: Family Hist. (Parent hip fracture)  Based on Femur (Left) Neck BMD  1 -The 10-year probability of fracture may be lower than reported if the patient has received treatment. 2 -Major Osteoporotic Fracture: Clinical Spine, Forearm, Hip or Shoulder  *FRAX is a Materials engineer  of the [employer] for Metabolic Bone Disease, a [place] (WHO) [facility].  ASSESSMENT: The probability of a major osteoporotic fracture is 23.4 %within the next ten years.  The probability of a hip fracture is 13.3 %within the next ten years.  Mark A. Thornton Papas, M.D.  Certified [facility]   Electronically Signed   By: Lavonia Dana M.D.   On: 11/26/2014 15:03   Mm Digital Screening Unilat L  11/26/2014   CLINICAL DATA:  Screening.  EXAM: DIGITAL SCREENING UNILATERAL LEFT MAMMOGRAM WITH CAD  COMPARISON:  Previous exam(s).  ACR Breast Density Category c: The breast tissue is heterogeneously dense, which may obscure small masses.  FINDINGS: There are no findings suspicious for malignancy. Images were processed with CAD.  IMPRESSION: No mammographic evidence of malignancy. A result letter of this screening mammogram will be mailed directly to the patient.  RECOMMENDATION: Screening mammogram in one year. (Code:[id number])  BI-RADS CATEGORY  1: Negative.   Electronically Signed   By: Everlean Alstrom M.D.   On: 11/26/2014 14:42   Mr Darnelle Catalan Wo/w Cm  12/16/2014   CLINICAL DATA:  Headaches, double vision, balance issues. Symptoms for 1-2 months. Breast cancer.  EXAM: MRI HEAD AND ORBITS WITHOUT AND WITH CONTRAST  TECHNIQUE: Multiplanar, multiecho pulse sequences of the brain and surrounding structures were obtained without and with intravenous contrast. Multiplanar, multiecho pulse sequences of the orbits and surrounding structures were obtained including fat saturation techniques, before and after intravenous contrast administration.  CONTRAST:  38mL MULTIHANCE GADOBENATE DIMEGLUMINE 529 MG/ML IV SOLN  COMPARISON:  None.  FINDINGS: MRI HEAD FINDINGS  The patient was unable to remain motionless for the exam. Small or subtle lesions could be overlooked.  No acute stroke, acute hemorrhage, mass lesion, hydrocephalus, or extra-axial  fluid. Mild atrophy. Moderate T2 and FLAIR hyperintensity throughout the white matter, likely small vessel disease. No chronic hemorrhage. Flow voids are maintained. Post infusion, there is no abnormal intra-axial enhancement. However, there is osseous metastatic disease to the LEFT frontal bone, with a large deposit measuring 15 x 23 x 20 mm with adjacent dural enhancement. No leptomeningeal enhancement is evident. No visible frontal vasogenic edema. Additional tiny enhancing lesions throughout other areas of the calvarium are also observed. No skull base disease but mild pannus is noted.  MRI ORBITS FINDINGS  Infiltrative, avidly enhancing masses occupy the extraconal and intraconal spaces of the orbits, inseparable from the orbital musculature anteriorly, surrounding the optic nerves, compressing it on the LEFT, extending into the superficial soft tissues of the face greater on the RIGHT. No ocular enhancement. Slight enophthalmos may be present on the LEFT, versus exophthalmos on the RIGHT.No cavernous sinus extension. No paranasal sinus disease. No definite subfrontal intracranial extension.  IMPRESSION: Constellation of MR head and MR orbits findings consistent with widespread metastatic disease from breast cancer. Avidly enhancing and infiltrative neoplastic disease is inseparable from the orbital musculature, is multicompartmental, and compresses the the LEFT greater than RIGHT optic nerves. No cavernous sinus extension.  Infiltrative orbital enhancement similar to this can be seen in lymphoproliferative disorders, but is  less favored, given the osseous metastatic disease to the calvarium, as well as the personal history of breast cancer. Tissue sampling is warranted.  No intra-axial brain metastases are observed. Mild atrophy with chronic microvascular ischemic changes noted.  Findings discussed with ordering provider at time of interpretation.   Electronically Signed   By: Staci Righter M.D.   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  PLAN:    1.  Breast cancer: Imaging results reviewed independently and reported as above with widespread metastatic disease including brain and bone metastasis. Patient last received chemotherapy in September 2011. She is currently taking letrozole which she has been instructed to discontinue. Patient was also evaluated by radiation oncology today. Return to clinic in 1 week to discuss her imaging results and consideration of palliative chemotherapy. Patient will also benefit from Zometa given her widespread bony metastasis. 2. Brain metastasis: Evaluation by radiation oncology. Patient was given a prescription for Decadron and will start palliative XRT in the next 1-2 weeks.  3.  Pulmonary nodules: Previous CT scan revealed stable nodules, but cannot exclude metastatic disease.  Approximately 30 minutes was spent in discussion and consultation.  Patient expressed understanding and was in agreement with this plan. She also understands that She can call clinic at any time with any questions, concerns, or complaints.   Breast cancer   Staging form: Breast, AJCC 7th Edition     Clinical stage from 12/22/2014: Stage IV (T1c, N3, M1) - Signed by Lloyd Huger, MD on 12/22/2014   Lloyd Huger, MD   12/22/2014 9:43 AM

## 2014-12-24 DIAGNOSIS — Z510 Encounter for antineoplastic radiation therapy: Secondary | ICD-10-CM | POA: Diagnosis not present

## 2014-12-25 ENCOUNTER — Telehealth: Payer: Self-pay | Admitting: *Deleted

## 2014-12-25 ENCOUNTER — Ambulatory Visit
Admission: RE | Admit: 2014-12-25 | Discharge: 2014-12-25 | Disposition: A | Discharge status: Home / Self Care | Payer: Medicare Other | Source: Ambulatory Visit | Attending: Radiation Oncology | Admitting: Radiation Oncology

## 2014-12-25 DIAGNOSIS — Z510 Encounter for antineoplastic radiation therapy: Secondary | ICD-10-CM | POA: Diagnosis not present

## 2014-12-25 NOTE — Telephone Encounter (Signed)
Received call from [facility] regarding patient not tolerating high dose of dexamethasone, Per Dr. Grayland Ormond patient to reduce dose to 1 tablet (4 mg) twice a day.

## 2014-12-26 ENCOUNTER — Ambulatory Visit: Payer: Medicare Other | Admitting: Oncology

## 2014-12-26 ENCOUNTER — Other Ambulatory Visit: Payer: Medicare Other

## 2014-12-26 ENCOUNTER — Ambulatory Visit
Admission: RE | Admit: 2014-12-26 | Discharge: 2014-12-26 | Disposition: A | Discharge status: Home / Self Care | Payer: Medicare Other | Source: Ambulatory Visit | Attending: Radiation Oncology | Admitting: Radiation Oncology

## 2014-12-26 DIAGNOSIS — Z510 Encounter for antineoplastic radiation therapy: Secondary | ICD-10-CM | POA: Diagnosis not present

## 2014-12-27 ENCOUNTER — Ambulatory Visit
Admission: RE | Admit: 2014-12-27 | Discharge: 2014-12-27 | Disposition: A | Discharge status: Home / Self Care | Payer: Medicare Other | Source: Ambulatory Visit | Attending: Radiation Oncology | Admitting: Radiation Oncology

## 2014-12-27 DIAGNOSIS — Z510 Encounter for antineoplastic radiation therapy: Secondary | ICD-10-CM | POA: Diagnosis not present

## 2014-12-30 ENCOUNTER — Ambulatory Visit
Admission: RE | Admit: 2014-12-30 | Discharge: 2014-12-30 | Disposition: A | Discharge status: Home / Self Care | Payer: Medicare Other | Source: Ambulatory Visit | Attending: Radiation Oncology | Admitting: Radiation Oncology

## 2014-12-30 DIAGNOSIS — Z510 Encounter for antineoplastic radiation therapy: Secondary | ICD-10-CM | POA: Diagnosis not present

## 2014-12-31 ENCOUNTER — Inpatient Hospital Stay (HOSPITAL_BASED_OUTPATIENT_CLINIC_OR_DEPARTMENT_OTHER): Payer: Medicare Other | Admitting: Oncology

## 2014-12-31 ENCOUNTER — Ambulatory Visit
Admission: RE | Admit: 2014-12-31 | Discharge: 2014-12-31 | Disposition: A | Discharge status: Home / Self Care | Payer: Medicare Other | Source: Ambulatory Visit | Attending: Radiation Oncology | Admitting: Radiation Oncology

## 2014-12-31 ENCOUNTER — Inpatient Hospital Stay: Payer: Medicare Other | Attending: Oncology

## 2014-12-31 VITALS — BP 130/85 | HR 77 | Temp 96.9°F | Resp 16 | Wt 115.9 lb

## 2014-12-31 DIAGNOSIS — G4700 Insomnia, unspecified: Secondary | ICD-10-CM | POA: Insufficient documentation

## 2014-12-31 DIAGNOSIS — E780 Pure hypercholesterolemia: Secondary | ICD-10-CM | POA: Diagnosis not present

## 2014-12-31 DIAGNOSIS — R630 Anorexia: Secondary | ICD-10-CM | POA: Diagnosis not present

## 2014-12-31 DIAGNOSIS — Z86718 Personal history of other venous thrombosis and embolism: Secondary | ICD-10-CM | POA: Insufficient documentation

## 2014-12-31 DIAGNOSIS — K219 Gastro-esophageal reflux disease without esophagitis: Secondary | ICD-10-CM | POA: Diagnosis not present

## 2014-12-31 DIAGNOSIS — R9389 Abnormal findings on diagnostic imaging of other specified body structures: Secondary | ICD-10-CM

## 2014-12-31 DIAGNOSIS — R970 Elevated carcinoembryonic antigen [CEA]: Secondary | ICD-10-CM | POA: Diagnosis not present

## 2014-12-31 DIAGNOSIS — R5383 Other fatigue: Secondary | ICD-10-CM | POA: Insufficient documentation

## 2014-12-31 DIAGNOSIS — R918 Other nonspecific abnormal finding of lung field: Secondary | ICD-10-CM | POA: Insufficient documentation

## 2014-12-31 DIAGNOSIS — R1909 Other intra-abdominal and pelvic swelling, mass and lump: Secondary | ICD-10-CM | POA: Insufficient documentation

## 2014-12-31 DIAGNOSIS — C7931 Secondary malignant neoplasm of brain: Secondary | ICD-10-CM

## 2014-12-31 DIAGNOSIS — K6289 Other specified diseases of anus and rectum: Secondary | ICD-10-CM | POA: Insufficient documentation

## 2014-12-31 DIAGNOSIS — Z170 Estrogen receptor positive status [ER+]: Secondary | ICD-10-CM | POA: Diagnosis not present

## 2014-12-31 DIAGNOSIS — C7951 Secondary malignant neoplasm of bone: Secondary | ICD-10-CM | POA: Diagnosis not present

## 2014-12-31 DIAGNOSIS — R5381 Other malaise: Secondary | ICD-10-CM | POA: Diagnosis not present

## 2014-12-31 DIAGNOSIS — R971 Elevated cancer antigen 125 [CA 125]: Secondary | ICD-10-CM | POA: Diagnosis not present

## 2014-12-31 DIAGNOSIS — R531 Weakness: Secondary | ICD-10-CM | POA: Diagnosis not present

## 2014-12-31 DIAGNOSIS — C50919 Malignant neoplasm of unspecified site of unspecified female breast: Secondary | ICD-10-CM | POA: Diagnosis not present

## 2014-12-31 DIAGNOSIS — Z9221 Personal history of antineoplastic chemotherapy: Secondary | ICD-10-CM | POA: Insufficient documentation

## 2014-12-31 DIAGNOSIS — F418 Other specified anxiety disorders: Secondary | ICD-10-CM | POA: Diagnosis not present

## 2014-12-31 DIAGNOSIS — Z923 Personal history of irradiation: Secondary | ICD-10-CM | POA: Diagnosis not present

## 2014-12-31 DIAGNOSIS — Z79811 Long term (current) use of aromatase inhibitors: Secondary | ICD-10-CM | POA: Diagnosis not present

## 2014-12-31 DIAGNOSIS — I10 Essential (primary) hypertension: Secondary | ICD-10-CM | POA: Diagnosis not present

## 2014-12-31 DIAGNOSIS — R634 Abnormal weight loss: Secondary | ICD-10-CM | POA: Diagnosis not present

## 2014-12-31 DIAGNOSIS — Z510 Encounter for antineoplastic radiation therapy: Secondary | ICD-10-CM | POA: Diagnosis not present

## 2014-12-31 LAB — COMPREHENSIVE METABOLIC PANEL
ALBUMIN: 3.4 g/dL — AB (ref 3.5–5.0)
ALT: 22 U/L (ref 14–54)
AST: 30 U/L (ref 15–41)
Alkaline Phosphatase: 57 U/L (ref 38–126)
Anion gap: 7 (ref 5–15)
BILIRUBIN TOTAL: 0.9 mg/dL (ref 0.3–1.2)
BUN: 28 mg/dL — ABNORMAL HIGH (ref 6–20)
CALCIUM: 9.3 mg/dL (ref 8.9–10.3)
CHLORIDE: 92 mmol/L — AB (ref 101–111)
CO2: 30 mmol/L (ref 22–32)
CREATININE: 0.65 mg/dL (ref 0.44–1.00)
GFR calc Af Amer: >60 mL/min (ref >60)
GFR calc non Af Amer: >60 mL/min (ref >60)
Glucose, Bld: 112 mg/dL — ABNORMAL HIGH (ref 65–99)
Potassium: 2.5 mmol/L — CL (ref 3.5–5.1)
SODIUM: 129 mmol/L — AB (ref 135–145)
TOTAL PROTEIN: 6.9 g/dL (ref 6.5–8.1)

## 2014-12-31 LAB — CBC WITH DIFFERENTIAL/PLATELET
BASOS ABS: 0 10*3/uL (ref 0–0.1)
BASOS PCT: 0 %
EOS ABS: 0 10*3/uL (ref 0–0.7)
Eosinophils Relative: 0 %
HEMATOCRIT: 38.8 % (ref 35.0–47.0)
HEMOGLOBIN: 13.1 g/dL (ref 12.0–16.0)
Lymphocytes Relative: 14 %
Lymphs Abs: 1.1 10*3/uL (ref 1.0–3.6)
MCH: 30.6 pg (ref 26.0–34.0)
MCHC: 33.8 g/dL (ref 32.0–36.0)
MCV: 90.3 fL (ref 80.0–100.0)
MONO ABS: 0.7 10*3/uL (ref 0.2–0.9)
MONOS PCT: 9 %
NEUTROS ABS: 6.1 10*3/uL (ref 1.4–6.5)
NEUTROS PCT: 77 %
Platelets: 249 10*3/uL (ref 150–440)
RBC: 4.3 MIL/uL (ref 3.80–5.20)
RDW: 13.9 % (ref 11.5–14.5)
WBC: 8 10*3/uL (ref 3.6–11.0)

## 2014-12-31 MED ORDER — ALPRAZOLAM 0.25 MG PO TABS: 0.2500 mg | ORAL_TABLET | Freq: Every evening | ORAL | Status: DC | PRN

## 2014-12-31 NOTE — Progress Notes (Signed)
Patient has not been feeling well since starting the Dexamethasone.  Having symptoms of insomnia despite taking Trazodone, loss of appetite resulting in weight loss not able to tolerate Ensure.  She becomes tearful in the room and requesting a medication for depression/anxiety

## 2015-01-01 ENCOUNTER — Ambulatory Visit: Admission: RE | Admit: 2015-01-01 | Payer: Medicare Other | Source: Ambulatory Visit

## 2015-01-01 ENCOUNTER — Inpatient Hospital Stay: Payer: Medicare Other

## 2015-01-01 ENCOUNTER — Ambulatory Visit
Admission: RE | Admit: 2015-01-01 | Discharge: 2015-01-01 | Disposition: A | Discharge status: Home / Self Care | Payer: Medicare Other | Source: Ambulatory Visit | Attending: Radiation Oncology | Admitting: Radiation Oncology

## 2015-01-01 DIAGNOSIS — C50919 Malignant neoplasm of unspecified site of unspecified female breast: Secondary | ICD-10-CM

## 2015-01-01 DIAGNOSIS — C50911 Malignant neoplasm of unspecified site of right female breast: Secondary | ICD-10-CM

## 2015-01-01 LAB — CANCER ANTIGEN 27.29: CA 27.29: 185.9 U/mL — AB (ref 0.0–38.6)

## 2015-01-01 MED ORDER — SODIUM CHLORIDE 0.9 % IV SOLN
Freq: Once | INTRAVENOUS | Status: AC
  Administered 2015-01-01: 12:00:00 via INTRAVENOUS
  Filled 2015-01-01: qty 1000

## 2015-01-02 ENCOUNTER — Other Ambulatory Visit: Payer: Self-pay | Admitting: Oncology

## 2015-01-02 ENCOUNTER — Ambulatory Visit: Payer: Medicare Other

## 2015-01-02 ENCOUNTER — Other Ambulatory Visit: Payer: Self-pay

## 2015-01-02 DIAGNOSIS — N390 Urinary tract infection, site not specified: Secondary | ICD-10-CM

## 2015-01-02 DIAGNOSIS — C50911 Malignant neoplasm of unspecified site of right female breast: Secondary | ICD-10-CM

## 2015-01-02 LAB — URINALYSIS COMPLETE WITH MICROSCOPIC (ARMC ONLY)
Bilirubin Urine: NEGATIVE
GLUCOSE, UA: NEGATIVE mg/dL
Nitrite: NEGATIVE
Protein, ur: 30 mg/dL — AB
Specific Gravity, Urine: 1.018 (ref 1.005–1.030)
Squamous Epithelial / LPF: NONE SEEN
pH: 5 (ref 5.0–8.0)

## 2015-01-02 LAB — CEA: CEA: 11.2 ng/mL — ABNORMAL HIGH (ref 0.0–4.7)

## 2015-01-02 LAB — CANCER ANTIGEN 27.29: CA 27.29: 201.6 U/mL — AB (ref 0.0–38.6)

## 2015-01-02 LAB — CA 125: CA 125: 590.1 U/mL — AB (ref 0.0–38.1)

## 2015-01-02 MED ORDER — SULFAMETHOXAZOLE-TRIMETHOPRIM 800-160 MG PO TABS: 1.0000 | ORAL_TABLET | Freq: Two times a day (BID) | ORAL | Status: DC

## 2015-01-03 ENCOUNTER — Inpatient Hospital Stay: Payer: Medicare Other

## 2015-01-03 ENCOUNTER — Ambulatory Visit: Payer: Medicare Other

## 2015-01-03 VITALS — BP 156/71 | HR 66 | Temp 98.4°F | Resp 20

## 2015-01-03 DIAGNOSIS — E860 Dehydration: Secondary | ICD-10-CM

## 2015-01-03 DIAGNOSIS — C50919 Malignant neoplasm of unspecified site of unspecified female breast: Secondary | ICD-10-CM | POA: Diagnosis not present

## 2015-01-03 MED ORDER — SODIUM CHLORIDE 0.9 % IV SOLN
Freq: Once | INTRAVENOUS | Status: AC
  Administered 2015-01-03: 12:00:00 via INTRAVENOUS
  Filled 2015-01-03: qty 1000

## 2015-01-03 MED ORDER — SODIUM CHLORIDE 0.9 % IV SOLN
Freq: Once | INTRAVENOUS | Status: AC
  Administered 2015-01-03: 13:00:00 via INTRAVENOUS
  Filled 2015-01-03: qty 1000

## 2015-01-04 LAB — URINE CULTURE

## 2015-01-06 ENCOUNTER — Ambulatory Visit
Admission: RE | Admit: 2015-01-06 | Discharge: 2015-01-06 | Disposition: A | Discharge status: Home / Self Care | Payer: Medicare Other | Source: Ambulatory Visit | Attending: Radiation Oncology | Admitting: Radiation Oncology

## 2015-01-06 DIAGNOSIS — Z510 Encounter for antineoplastic radiation therapy: Secondary | ICD-10-CM | POA: Diagnosis not present

## 2015-01-07 ENCOUNTER — Ambulatory Visit
Admission: RE | Admit: 2015-01-07 | Discharge: 2015-01-07 | Disposition: A | Discharge status: Home / Self Care | Payer: Medicare Other | Source: Ambulatory Visit | Attending: Radiation Oncology | Admitting: Radiation Oncology

## 2015-01-07 DIAGNOSIS — Z510 Encounter for antineoplastic radiation therapy: Secondary | ICD-10-CM | POA: Diagnosis not present

## 2015-01-07 NOTE — Patient Instructions (Signed)
Eribulin solution for injection What is this medicine? ERIBULIN is a chemotherapy drug. It is used to treat breast cancer. This medicine may be used for other purposes; ask your health care provider or pharmacist if you have questions. COMMON BRAND NAME(S): [place] What should I tell my health care provider before I take this medicine? They need to know if you have any of these conditions: -heart disease -kidney disease -liver disease -low blood counts, like low white cell, platelet, or red cell counts -an unusual or allergic reaction to eribulin, other medicines, foods, dyes, or preservatives -pregnant or trying to get pregnant -breast-feeding How should I use this medicine? This medicine is for infusion into a vein. It is given by a health care professional in a hospital or clinic setting. Talk to your pediatrician regarding the use of this medicine in children. Special care may be needed. Overdosage: If you think you've taken too much of this medicine contact a poison control center or emergency room at once. Overdosage: If you think you have taken too much of this medicine contact a poison control center or emergency room at once. NOTE: This medicine is only for you. Do not share this medicine with others. What if I miss a dose? It is important not to miss your dose. Call your doctor or health care professional if you are unable to keep an appointment. What may interact with this medicine? Do not take this medicine with any of the following medications: -amiodarone -astemizole -arsenic trioxide -bepridil -bretylium -chloroquine -chlorpromazine -cisapride -clarithromycin -dextromethorphan,  quinidine -disopyramide -dofetilide -droperidol -dronedarone -erythromycin -grepafloxacin -halofantrine -haloperidol -ibutilide -levomethadyl -mesoridazine -methadone -pentamidine -procainamide -quinidine -pimozide -posaconazole -probucol -propafenone -saquinavir -sotalol -sparfloxacin -terfenadine -thioridazine -troleandomycin -ziprasidone This list may not describe all possible interactions. Give your health care provider a list of all the medicines, herbs, non-prescription drugs, or dietary supplements you use. Also tell them if you smoke, drink alcohol, or use illegal drugs. Some items may interact with your medicine. What should I watch for while using this medicine? Your condition will be monitored carefully while you are receiving this medicine. This drug may make you feel generally unwell. This is not uncommon, as chemotherapy can affect healthy cells as well as cancer cells. Report any side effects. Continue your course of treatment even though you feel ill unless your doctor tells you to stop. Call your doctor or health care professional for advice if you get a fever, chills or sore throat, or other symptoms of a cold or flu. Do not treat yourself. This drug decreases your body's ability to fight infections. Try to avoid being around people who are sick. This medicine may increase your risk to bruise or bleed. Call your doctor or health care professional if you notice any unusual bleeding. Be careful brushing and flossing your teeth or using a toothpick because you may get an infection or bleed more easily. If you have any dental work done, tell your dentist you are receiving this medicine. Avoid taking products that contain aspirin, acetaminophen, ibuprofen, naproxen, or ketoprofen unless instructed by your doctor. These medicines may hide a fever. Do not become pregnant while taking this medicine. Women should inform their doctor if they wish to become pregnant or think  they might be pregnant. There is a potential for serious side effects to an unborn child. Talk to your health care professional or pharmacist for more information. Do not breast-feed an infant while taking this medicine. What side effects may I notice from receiving this medicine?  Side effects that you should report to your doctor or health care professional as soon as possible: -allergic reactions like skin rash, itching or hives, swelling of the face, lips, or tongue -low blood counts - this medicine may decrease the number of white blood cells, red blood cells and platelets. You may be at increased risk for infections and bleeding. -signs of infection - fever or chills, cough, sore throat, pain or difficulty passing urine -signs of decreased platelets or bleeding - bruising, pinpoint red spots on the skin, black, tarry stools, blood in the urine -signs of decreased red blood cells - unusually weak or tired, fainting spells, lightheadedness -pain, tingling, numbness in the hands or feet Side effects that usually do not require medical attention (Report these to your doctor or health care professional if they continue or are bothersome.): -constipation -hair loss -headache -loss of appetite -muscle or joint pain -nausea, vomiting -stomach pain This list may not describe all possible side effects. Call your doctor for medical advice about side effects. You may report side effects to FDA at 1-800-FDA-1088. Where should I keep my medicine? This drug is given in a hospital or clinic and will not be stored at home. NOTE: This sheet is a summary. It may not cover all possible information. If you have questions about this medicine, talk to your doctor, pharmacist, or health care provider.  2015, Elsevier/Gold Standard. (2009-03-27 23:04:37) Zoledronic Acid injection (Hypercalcemia, Oncology) What is this medicine? ZOLEDRONIC ACID (ZOE le dron ik AS id) lowers the amount of calcium loss from bone.  It is used to treat too much calcium in your blood from cancer. It is also used to prevent complications of cancer that has spread to the bone. This medicine may be used for other purposes; ask your health care provider or pharmacist if you have questions. COMMON BRAND NAME(S): Zometa What should I tell my health care provider before I take this medicine? They need to know if you have any of these conditions: -aspirin-sensitive asthma -cancer, especially if you are receiving medicines used to treat cancer -dental disease or wear dentures -infection -kidney disease -receiving corticosteroids like dexamethasone or prednisone -an unusual or allergic reaction to zoledronic acid, other medicines, foods, dyes, or preservatives -pregnant or trying to get pregnant -breast-feeding How should I use this medicine? This medicine is for infusion into a vein. It is given by a health care professional in a hospital or clinic setting. Talk to your pediatrician regarding the use of this medicine in children. Special care may be needed. Overdosage: If you think you have taken too much of this medicine contact a poison control center or emergency room at once. NOTE: This medicine is only for you. Do not share this medicine with others. What if I miss a dose? It is important not to miss your dose. Call your doctor or health care professional if you are unable to keep an appointment. What may interact with this medicine? -certain antibiotics given by injection -NSAIDs, medicines for pain and inflammation, like ibuprofen or naproxen -some diuretics like bumetanide, furosemide -teriparatide -thalidomide This list may not describe all possible interactions. Give your health care provider a list of all the medicines, herbs, non-prescription drugs, or dietary supplements you use. Also tell them if you smoke, drink alcohol, or use illegal drugs. Some items may interact with your medicine. What should I watch for  while using this medicine? Visit your doctor or health care professional for regular checkups. It may be some time  before you see the benefit from this medicine. Do not stop taking your medicine unless your doctor tells you to. Your doctor may order blood tests or other tests to see how you are doing. Women should inform their doctor if they wish to become pregnant or think they might be pregnant. There is a potential for serious side effects to an unborn child. Talk to your health care professional or pharmacist for more information. You should make sure that you get enough calcium and vitamin D while you are taking this medicine. Discuss the foods you eat and the vitamins you take with your health care professional. Some people who take this medicine have severe bone, joint, and/or muscle pain. This medicine may also increase your risk for jaw problems or a broken thigh bone. Tell your doctor right away if you have severe pain in your jaw, bones, joints, or muscles. Tell your doctor if you have any pain that does not go away or that gets worse. Tell your dentist and dental surgeon that you are taking this medicine. You should not have major dental surgery while on this medicine. See your dentist to have a dental exam and fix any dental problems before starting this medicine. Take good care of your teeth while on this medicine. Make sure you see your dentist for regular follow-up appointments. What side effects may I notice from receiving this medicine? Side effects that you should report to your doctor or health care professional as soon as possible: -allergic reactions like skin rash, itching or hives, swelling of the face, lips, or tongue -anxiety, confusion, or depression -breathing problems -changes in vision -eye pain -feeling faint or lightheaded, falls -jaw pain, especially after dental work -mouth sores -muscle cramps, stiffness, or weakness -trouble passing urine or change in the amount  of urine Side effects that usually do not require medical attention (report to your doctor or health care professional if they continue or are bothersome): -bone, joint, or muscle pain -constipation -diarrhea -fever -hair loss -irritation at site where injected -loss of appetite -nausea, vomiting -stomach upset -trouble sleeping -trouble swallowing -weak or tired This list may not describe all possible side effects. Call your doctor for medical advice about side effects. You may report side effects to FDA at 1-800-FDA-1088. Where should I keep my medicine? This drug is given in a hospital or clinic and will not be stored at home. NOTE: This sheet is a summary. It may not cover all possible information. If you have questions about this medicine, talk to your doctor, pharmacist, or health care provider.  2015, [employer]vier/Gold Standard. (2012-09-14 13:03:13)

## 2015-01-08 ENCOUNTER — Ambulatory Visit
Admission: RE | Admit: 2015-01-08 | Discharge: 2015-01-08 | Disposition: A | Discharge status: Home / Self Care | Payer: Medicare Other | Source: Ambulatory Visit | Attending: Radiation Oncology | Admitting: Radiation Oncology

## 2015-01-08 DIAGNOSIS — Z510 Encounter for antineoplastic radiation therapy: Secondary | ICD-10-CM | POA: Diagnosis not present

## 2015-01-08 NOTE — Progress Notes (Signed)
[place]  [phone] [phone]  ID: Breanna Curtis OB: 06-23-1933  MR#: [id number]  KNL#:[id number]  Patient Care Team: Kirk Ruths, MD as PCP - General (Internal Medicine)  CHIEF COMPLAINT:  Chief Complaint  Patient presents with  . Breast Cancer    brain mets    INTERVAL HISTORY: Patient returns for further evaluation, discussion of her imaging results, and treatment planning. Her performance status has declined slightly and she feels more weak and fatigued. She does not complain of visual changes today. She denies any recent fevers. She has fair appetite and denies weight loss. She denies any pain. She has no chest pain or shortness of breath. She denies any nausea, vomiting, constipation, or diarrhea. She has no urinary complaints. Patient offers no further specific complaints.   REVIEW OF SYSTEMS:   Review of Systems  Constitutional: Positive for malaise/fatigue. Negative for fever and weight loss.  Eyes: Negative for blurred vision and double vision.  Respiratory: Negative.   Cardiovascular: Negative.   Musculoskeletal: Negative.   Neurological: Positive for weakness. Negative for headaches.    As per HPI. Otherwise, a complete review of systems is negatve.  PAST MEDICAL HISTORY: Past Medical History  Diagnosis Date  . Hypertension   . Hypercholesterolemia   . History of depression   . History of anxiety   . GERD (gastroesophageal reflux disease)   . DVT (deep venous thrombosis)   . Breast cancer     right breast ca-2011    PAST SURGICAL HISTORY: Past Surgical History  Procedure Laterality Date  . Mastectomy Right 2011    rt mastectomy-chemo/rad  . Breast cyst aspiration Left     lt fna-neg  . Breast biopsy Left     neg  . Cholecystectomy    . Appendectomy    . Cyst removed from rectum      FAMILY HISTORY Family History  Problem Relation Age of Onset  . Breast cancer Maternal Aunt 34  .  Hypertension Mother   . Hyperlipidemia Mother   . Heart failure Mother   . Arthritis Mother   . Hypertension Sister   . Hyperlipidemia Sister   . COPD Sister        ADVANCED DIRECTIVES:    HEALTH MAINTENANCE: Social History  Substance Use Topics  . Smoking status: Not on file  . Smokeless tobacco: Not on file  . Alcohol Use: Not on file     Colonoscopy:  PAP:  Bone density:  Lipid panel:  No Known Allergies  Current Outpatient Prescriptions  Medication Sig Dispense Refill  . dexamethasone (DECADRON) 4 MG tablet Take 2 tablets (8 mg total) by mouth 2 (two) times daily. (Patient taking differently: Take 4 mg by mouth 2 (two) times daily. ) 60 tablet 0  . letrozole (FEMARA) 2.5 MG tablet Take by mouth.    . losartan-hydrochlorothiazide (HYZAAR) 100-25 MG per tablet Take by mouth.    . Multiple Vitamin (MULTIVITAMIN) capsule Take by mouth.    Marland Kitchen omeprazole (PRILOSEC) 20 MG capsule Take by mouth.    . sertraline (ZOLOFT) 50 MG tablet Take by mouth.    . simvastatin (ZOCOR) 20 MG tablet Take by mouth.    . traZODone (DESYREL) 50 MG tablet Take by mouth.    . ALPRAZolam (XANAX) 0.25 MG tablet Take 1 tablet (0.25 mg total) by mouth at bedtime as needed for anxiety. 30 tablet 2  . sulfamethoxazole-trimethoprim (BACTRIM DS,SEPTRA DS) 800-160 MG per tablet Take 1 tablet by  mouth 2 (two) times daily. 14 tablet 2   No current facility-administered medications for this visit.    OBJECTIVE: Filed Vitals:   12/31/14 1117  BP: 130/85  Pulse: 77  Temp: 96.9 F (36.1 C)  Resp: 16     There is no height on file to calculate BMI.    ECOG FS:1 - Symptomatic but completely ambulatory  General: Well-developed, well-nourished, no acute distress. Eyes: Pink conjunctiva, anicteric sclera. Breasts: Right mastectomy. Exam deferred today. Lungs: Clear to auscultation bilaterally. Heart: Regular rate and rhythm. No rubs, murmurs, or gallops. Abdomen: Soft, nontender, nondistended. No  organomegaly noted, normoactive bowel sounds. Musculoskeletal: No edema, cyanosis, or clubbing. Neuro: Alert, answering all questions appropriately. Cranial nerves grossly intact. Skin: No rashes or petechiae noted. Psych: Normal affect.   LAB RESULTS:  Lab Results  Component Value Date   NA 129* 12/31/2014   K 2.5* 12/31/2014   CL 92* 12/31/2014   CO2 30 12/31/2014   GLUCOSE 112* 12/31/2014   BUN 28* 12/31/2014   CREATININE 0.65 12/31/2014   CALCIUM 9.3 12/31/2014   PROT 6.9 12/31/2014   ALBUMIN 3.4* 12/31/2014   AST 30 12/31/2014   ALT 22 12/31/2014   ALKPHOS 57 12/31/2014   BILITOT 0.9 12/31/2014   GFRNONAA >60 12/31/2014   GFRAA >60 12/31/2014    Lab Results  Component Value Date   WBC 8.0 12/31/2014   NEUTROABS 6.1 12/31/2014   HGB 13.1 12/31/2014   HCT 38.8 12/31/2014   MCV 90.3 12/31/2014   PLT 249 12/31/2014     STUDIES: Ct Chest W Contrast  12/20/2014   CLINICAL DATA:  Right breast cancer. Prior mastectomy, chemotherapy, and radiation therapy in 2011. Restaging/surveillance. Preprocedural for radiation therapy next week for brain metastatic disease. Osseous metastatic disease on bone scan yesterday.  EXAM: CT CHEST, ABDOMEN, AND PELVIS WITH CONTRAST  TECHNIQUE: Multidetector CT imaging of the chest, abdomen and pelvis was performed following the standard protocol during bolus administration of intravenous contrast.  CONTRAST:  154mL OMNIPAQUE IOHEXOL 300 MG/ML  SOLN  COMPARISON:  Multiple exams, including 12/19/2014 and 12/17/2010  FINDINGS: CT CHEST FINDINGS  Mediastinum/Nodes: Hypodense 9 mm left thyroid nodule.  Moderate cardiomegaly. Distal esophageal wall thickening, mild. Small to moderate-sized hiatal hernia.  Small mediastinal lymph nodes are not pathologically enlarged. Right mastectomy. Right subpectoral node 5 mm in short axis on image 13 series 2, previously 3 mm.  Lungs/Pleura: Trace right pleural effusion. Mild infiltration of the right anterior  pericardial adipose tissue, image 37 series 2. Pleural thickening anteriorly adjacent to the right middle lobe, images 37-39 series 2.  Mild scarring in the left lower lobe. 3 mm left upper lobe nodule, image 14 series 4, no change from 2012, considered benign.  Musculoskeletal: Severe bony demineralization. Healing right posterolateral ninth rib fracture, image 44 series 4. Healing fracture of the left sixth rib the rib on image 30 series 4, possibly pathologic. Extensive thoracic spondylosis and demineralization. Lucency in the base of the T11 spinous process, image 87 series 6. Lytic lesion in the right posterior vertebral body and pedicle at T10, image 80 series 6.  CT ABDOMEN PELVIS FINDINGS  Hepatobiliary: Chronic pneumobilia. Lateral segment left hepatic lobe cyst, image 45 series 2, chronic. Cholecystectomy. Equivocally nodular liver contour.  Pancreas: Unremarkable  Spleen: 2.0 by 1.0 cm hypodense lesion medially in the dome of the spleen, image 38 series 2.  Adrenals/Urinary Tract: Bilateral hydronephrosis with distended collecting systems. Mild proximal hydroureter with more striking left distal hydroureter.  Poor definition of the ureters distally. Wall thickening of the urinary bladder.  Stomach/Bowel: Wall thickening in the stomach antrum may be due to contraction but is technically nonspecific. Mild prominence of stool in the colon. Prominent wall thickening in the rectum extending from the rectosigmoid junction to the lower rectum.  Vascular/Lymphatic: Mild aortoiliac atherosclerosis. Venous varicosities along the lower abdominal wall and subcutaneous tissues superficial to the pubis. Small external iliac lymph nodes are not pathologically enlarged.  Reproductive: Amorphous soft tissue densities are present along both adnexa. These are enlarged compared to 10/23/2009 and concerning for tumor deposits. Considerable indistinctness of surrounding tissue planes, tracking along the pelvic side walls.  Calcifications in the uterus likely from prior fibroids.  Other: Moderate scattered ascites. Fluid infiltration of the omentum with some mutlifocality/irregularity. Fluid infiltration of the mesentery. Tumor infiltration of the mesentery and omentum is a distinct possibility. Presacral edema noted.  Musculoskeletal: Bony demineralization. This may obscure metastatic lesions. Superior endplate compression fracture at L5 with grade 1 degenerative anterolisthesis at L5-S1. Vertical lucency along the right side of the L4 vertebral body on images 78-79 of series 6 could be from a nondisplaced fracture or simply an irregular spur. Old superior endplate compression fracture at L3. Multilevel degenerative disc disease in the lumbar spine.  IMPRESSION: 1. Marked wall thickening in portions of the rectum such that rectal tumor is not excluded. Increase in soft tissue density along the adnexa/ovaries compared to prior, metastatic deposits in this vicinity are not excluded. There is retroperitoneal, pelvic sidewall, mesenteric, and omental edema with enhancing margins of the ureters, and likely some degree of distal ureteral obstruction causing the bilateral hydronephrosis and ureteral dilatation and enhancement. Peritoneal spread of malignancy is not excluded given this constellation of findings. 2. Osseous metastatic disease to the T10 vertebra, potentially the T11 spinous process, and ribs. 3. Superior endplate compression fracture at L5. Possible nondisplaced right-sided cortical fracture of the L4 vertebral body. 4. Equivocally nodular liver contour, query cirrhosis. 5. Additional findings at individual levels are as follows: Cardiomegaly. Distal esophageal wall thickening, query esophagitis. Small to moderate hiatal hernia. Trace right pleural effusion with some anterior right pleural irregularity. Prominent bony demineralization. Several healing rib fractures, 1 of which may be pathologic. Chronic pneumobilia. 2.0 by 1.0  cm nonspecific hypodense lesion medially in the dome of the spleen, merits observation. Atherosclerosis. Venous varicosities along the lower abdominal wall subcutaneous tissues. Multilevel degenerative disc disease.   Electronically Signed   By: Van Clines M.D.   On: 12/20/2014 16:31   Mr Jeri Cos JK Contrast  12/16/2014   CLINICAL DATA:  Headaches, double vision, balance issues. Symptoms for 1-2 months. Breast cancer.  EXAM: MRI HEAD AND ORBITS WITHOUT AND WITH CONTRAST  TECHNIQUE: Multiplanar, multiecho pulse sequences of the brain and surrounding structures were obtained without and with intravenous contrast. Multiplanar, multiecho pulse sequences of the orbits and surrounding structures were obtained including fat saturation techniques, before and after intravenous contrast administration.  CONTRAST:  74mL MULTIHANCE GADOBENATE DIMEGLUMINE 529 MG/ML IV SOLN  COMPARISON:  None.  FINDINGS: MRI HEAD FINDINGS  The patient was unable to remain motionless for the exam. Small or subtle lesions could be overlooked.  No acute stroke, acute hemorrhage, mass lesion, hydrocephalus, or extra-axial fluid. Mild atrophy. Moderate T2 and FLAIR hyperintensity throughout the white matter, likely small vessel disease. No chronic hemorrhage. Flow voids are maintained. Post infusion, there is no abnormal intra-axial enhancement. However, there is osseous metastatic disease to the LEFT frontal bone, with  a large deposit measuring 15 x 23 x 20 mm with adjacent dural enhancement. No leptomeningeal enhancement is evident. No visible frontal vasogenic edema. Additional tiny enhancing lesions throughout other areas of the calvarium are also observed. No skull base disease but mild pannus is noted.  MRI ORBITS FINDINGS  Infiltrative, avidly enhancing masses occupy the extraconal and intraconal spaces of the orbits, inseparable from the orbital musculature anteriorly, surrounding the optic nerves, compressing it on the LEFT,  extending into the superficial soft tissues of the face greater on the RIGHT. No ocular enhancement. Slight enophthalmos may be present on the LEFT, versus exophthalmos on the RIGHT.No cavernous sinus extension. No paranasal sinus disease. No definite subfrontal intracranial extension.  IMPRESSION: Constellation of MR head and MR orbits findings consistent with widespread metastatic disease from breast cancer. Avidly enhancing and infiltrative neoplastic disease is inseparable from the orbital musculature, is multicompartmental, and compresses the the LEFT greater than RIGHT optic nerves. No cavernous sinus extension.  Infiltrative orbital enhancement similar to this can be seen in lymphoproliferative disorders, but is less favored, given the osseous metastatic disease to the calvarium, as well as the personal history of breast cancer. Tissue sampling is warranted.  No intra-axial brain metastases are observed. Mild atrophy with chronic microvascular ischemic changes noted.  Findings discussed with ordering provider at time of interpretation.   Electronically Signed   By: Staci Righter M.D.   On: 12/16/2014 13:56   Nm Bone Scan Whole Body  12/19/2014   CLINICAL DATA:  History of right breast malignancy, asymptomatic.  EXAM: NUCLEAR MEDICINE WHOLE BODY BONE SCAN  TECHNIQUE: Whole body anterior and posterior images were obtained approximately 3 hours after intravenous injection of radiopharmaceutical.  RADIOPHARMACEUTICALS:  23.26 mCi Technetium-37m MDP IV  COMPARISON:  MRI of the head of December 16, 2014  FINDINGS: There is adequate uptake of the radiopharmaceutical by the skeleton. There is intensely increased uptake within both kidneys. There is dilation of the midportion of the left ureter. The right ureter is not significantly dilated. Bladder activity is visible.  There are patchy areas of increased uptake in the posterior parietal bone on the right and in the left frontal bone. A focus of increased uptake in  the midline at the basiocciput is visible.  There is mildly increased uptake in the lateral masses of the cervical spine. There is increased uptake within approximately T4 and T5 an approximately T9 or T10. There is mildly increased uptake at approximately L3 and at L5-S1.  There are multiple areas of increased uptake within posterior ribs bilaterally.  There is intensely increased uptake in the first carpometacarpal joint on the right.  Uptake within the left acetabulum is mildly increased. Activity elsewhere in the pelvis is unremarkable. Increased uptake about the knees and in the ankles and feet is likely degenerative in nature.  IMPRESSION: 1. Radiopharmaceutical uptake pattern consistent with metastatic disease to the calvarium, thoracic and lumbar spine, bilateral ribs, and possibly left acetabulum. 2. Retention of the radiopharmaceutical within both kidneys possibly related to obstruction given the appearance of the dilated left ureter and mild prominence of the proximal right ureter. Abdominal and pelvic CT scanning is recommended.   Electronically Signed   By: David  Martinique M.D.   On: 12/19/2014 14:55   Ct Abdomen Pelvis W Contrast  12/20/2014   CLINICAL DATA:  Right breast cancer. Prior mastectomy, chemotherapy, and radiation therapy in 2011. Restaging/surveillance. Preprocedural for radiation therapy next week for brain metastatic disease. Osseous metastatic disease on bone  scan yesterday.  EXAM: CT CHEST, ABDOMEN, AND PELVIS WITH CONTRAST  TECHNIQUE: Multidetector CT imaging of the chest, abdomen and pelvis was performed following the standard protocol during bolus administration of intravenous contrast.  CONTRAST:  151mL OMNIPAQUE IOHEXOL 300 MG/ML  SOLN  COMPARISON:  Multiple exams, including 12/19/2014 and 12/17/2010  FINDINGS: CT CHEST FINDINGS  Mediastinum/Nodes: Hypodense 9 mm left thyroid nodule.  Moderate cardiomegaly. Distal esophageal wall thickening, mild. Small to moderate-sized hiatal  hernia.  Small mediastinal lymph nodes are not pathologically enlarged. Right mastectomy. Right subpectoral node 5 mm in short axis on image 13 series 2, previously 3 mm.  Lungs/Pleura: Trace right pleural effusion. Mild infiltration of the right anterior pericardial adipose tissue, image 37 series 2. Pleural thickening anteriorly adjacent to the right middle lobe, images 37-39 series 2.  Mild scarring in the left lower lobe. 3 mm left upper lobe nodule, image 14 series 4, no change from 2012, considered benign.  Musculoskeletal: Severe bony demineralization. Healing right posterolateral ninth rib fracture, image 44 series 4. Healing fracture of the left sixth rib the rib on image 30 series 4, possibly pathologic. Extensive thoracic spondylosis and demineralization. Lucency in the base of the T11 spinous process, image 87 series 6. Lytic lesion in the right posterior vertebral body and pedicle at T10, image 80 series 6.  CT ABDOMEN PELVIS FINDINGS  Hepatobiliary: Chronic pneumobilia. Lateral segment left hepatic lobe cyst, image 45 series 2, chronic. Cholecystectomy. Equivocally nodular liver contour.  Pancreas: Unremarkable  Spleen: 2.0 by 1.0 cm hypodense lesion medially in the dome of the spleen, image 38 series 2.  Adrenals/Urinary Tract: Bilateral hydronephrosis with distended collecting systems. Mild proximal hydroureter with more striking left distal hydroureter. Poor definition of the ureters distally. Wall thickening of the urinary bladder.  Stomach/Bowel: Wall thickening in the stomach antrum may be due to contraction but is technically nonspecific. Mild prominence of stool in the colon. Prominent wall thickening in the rectum extending from the rectosigmoid junction to the lower rectum.  Vascular/Lymphatic: Mild aortoiliac atherosclerosis. Venous varicosities along the lower abdominal wall and subcutaneous tissues superficial to the pubis. Small external iliac lymph nodes are not pathologically enlarged.   Reproductive: Amorphous soft tissue densities are present along both adnexa. These are enlarged compared to 10/23/2009 and concerning for tumor deposits. Considerable indistinctness of surrounding tissue planes, tracking along the pelvic side walls. Calcifications in the uterus likely from prior fibroids.  Other: Moderate scattered ascites. Fluid infiltration of the omentum with some mutlifocality/irregularity. Fluid infiltration of the mesentery. Tumor infiltration of the mesentery and omentum is a distinct possibility. Presacral edema noted.  Musculoskeletal: Bony demineralization. This may obscure metastatic lesions. Superior endplate compression fracture at L5 with grade 1 degenerative anterolisthesis at L5-S1. Vertical lucency along the right side of the L4 vertebral body on images 78-79 of series 6 could be from a nondisplaced fracture or simply an irregular spur. Old superior endplate compression fracture at L3. Multilevel degenerative disc disease in the lumbar spine.  IMPRESSION: 1. Marked wall thickening in portions of the rectum such that rectal tumor is not excluded. Increase in soft tissue density along the adnexa/ovaries compared to prior, metastatic deposits in this vicinity are not excluded. There is retroperitoneal, pelvic sidewall, mesenteric, and omental edema with enhancing margins of the ureters, and likely some degree of distal ureteral obstruction causing the bilateral hydronephrosis and ureteral dilatation and enhancement. Peritoneal spread of malignancy is not excluded given this constellation of findings. 2. Osseous metastatic disease to the T10  vertebra, potentially the T11 spinous process, and ribs. 3. Superior endplate compression fracture at L5. Possible nondisplaced right-sided cortical fracture of the L4 vertebral body. 4. Equivocally nodular liver contour, query cirrhosis. 5. Additional findings at individual levels are as follows: Cardiomegaly. Distal esophageal wall thickening, query  esophagitis. Small to moderate hiatal hernia. Trace right pleural effusion with some anterior right pleural irregularity. Prominent bony demineralization. Several healing rib fractures, 1 of which may be pathologic. Chronic pneumobilia. 2.0 by 1.0 cm nonspecific hypodense lesion medially in the dome of the spleen, merits observation. Atherosclerosis. Venous varicosities along the lower abdominal wall subcutaneous tissues. Multilevel degenerative disc disease.   Electronically Signed   By: Van Clines M.D.   On: 12/20/2014 16:31   Mr Darnelle Catalan Wo/w Cm  12/16/2014   CLINICAL DATA:  Headaches, double vision, balance issues. Symptoms for 1-2 months. Breast cancer.  EXAM: MRI HEAD AND ORBITS WITHOUT AND WITH CONTRAST  TECHNIQUE: Multiplanar, multiecho pulse sequences of the brain and surrounding structures were obtained without and with intravenous contrast. Multiplanar, multiecho pulse sequences of the orbits and surrounding structures were obtained including fat saturation techniques, before and after intravenous contrast administration.  CONTRAST:  7mL MULTIHANCE GADOBENATE DIMEGLUMINE 529 MG/ML IV SOLN  COMPARISON:  None.  FINDINGS: MRI HEAD FINDINGS  The patient was unable to remain motionless for the exam. Small or subtle lesions could be overlooked.  No acute stroke, acute hemorrhage, mass lesion, hydrocephalus, or extra-axial fluid. Mild atrophy. Moderate T2 and FLAIR hyperintensity throughout the white matter, likely small vessel disease. No chronic hemorrhage. Flow voids are maintained. Post infusion, there is no abnormal intra-axial enhancement. However, there is osseous metastatic disease to the LEFT frontal bone, with a large deposit measuring 15 x 23 x 20 mm with adjacent dural enhancement. No leptomeningeal enhancement is evident. No visible frontal vasogenic edema. Additional tiny enhancing lesions throughout other areas of the calvarium are also observed. No skull base disease but mild pannus is  noted.  MRI ORBITS FINDINGS  Infiltrative, avidly enhancing masses occupy the extraconal and intraconal spaces of the orbits, inseparable from the orbital musculature anteriorly, surrounding the optic nerves, compressing it on the LEFT, extending into the superficial soft tissues of the face greater on the RIGHT. No ocular enhancement. Slight enophthalmos may be present on the LEFT, versus exophthalmos on the RIGHT.No cavernous sinus extension. No paranasal sinus disease. No definite subfrontal intracranial extension.  IMPRESSION: Constellation of MR head and MR orbits findings consistent with widespread metastatic disease from breast cancer. Avidly enhancing and infiltrative neoplastic disease is inseparable from the orbital musculature, is multicompartmental, and compresses the the LEFT greater than RIGHT optic nerves. No cavernous sinus extension.  Infiltrative orbital enhancement similar to this can be seen in lymphoproliferative disorders, but is less favored, given the osseous metastatic disease to the calvarium, as well as the personal history of breast cancer. Tissue sampling is warranted.  No intra-axial brain metastases are observed. Mild atrophy with chronic microvascular ischemic changes noted.  Findings discussed with ordering provider at time of interpretation.   Electronically Signed   By: Staci Righter M.D.   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  PLAN:    1.  Breast cancer: Imaging results reviewed independently and reported as above with widespread metastatic disease including brain and bone metastasis. Patient last received chemotherapy in September 2011. Patient has initiated whole brain radiation, but  wishes to delay chemotherapy until this is complete. Patient will return to clinic on January 17, 2015 to initiate [place]. Plan to give treatment on days 1 and 8  with day 15 off. She will receive Zometa every 4 weeks.  2. Brain metastasis: Continue XRT and Decadron as ordered.  3. Rectal lesion/adnexal masses: Unclear etiology, possible metastatic disease or new primary? Patient's CA 125 and CEA are both elevated, consider biopsy in the near future.  Approximately 30 minutes was spent in discussion and consultation.  Patient expressed understanding and was in agreement with this plan. She also understands that She can call clinic at any time with any questions, concerns, or complaints.   Breast cancer   Staging form: Breast, AJCC 7th Edition     Clinical stage from 12/22/2014: Stage IV (T1c, N3, M1) - Signed by Lloyd Huger, MD on 12/22/2014   Lloyd Huger, MD   01/08/2015 1:42 PM

## 2015-01-09 ENCOUNTER — Ambulatory Visit
Admission: RE | Admit: 2015-01-09 | Discharge: 2015-01-09 | Disposition: A | Discharge status: Home / Self Care | Payer: Medicare Other | Source: Ambulatory Visit | Attending: Radiation Oncology | Admitting: Radiation Oncology

## 2015-01-09 ENCOUNTER — Telehealth: Payer: Self-pay

## 2015-01-09 ENCOUNTER — Other Ambulatory Visit: Payer: Self-pay | Admitting: *Deleted

## 2015-01-09 ENCOUNTER — Inpatient Hospital Stay: Payer: Medicare Other

## 2015-01-09 DIAGNOSIS — Z510 Encounter for antineoplastic radiation therapy: Secondary | ICD-10-CM | POA: Diagnosis not present

## 2015-01-09 MED ORDER — DEXAMETHASONE 4 MG PO TABS: 4.0000 mg | ORAL_TABLET | Freq: Every day | ORAL | Status: AC

## 2015-01-09 NOTE — Telephone Encounter (Signed)
Telephone call received from patient's daughter, Cecille Rubin.   PNS spoke with daughter and discussed new charitable fund [employer]. The patient will complete consent and charitable fund committee will  Assess patient for needed services.  Information left at front desk for patient.

## 2015-01-10 ENCOUNTER — Inpatient Hospital Stay: Payer: Medicare Other

## 2015-01-10 ENCOUNTER — Ambulatory Visit
Admission: RE | Admit: 2015-01-10 | Discharge: 2015-01-10 | Disposition: A | Discharge status: Home / Self Care | Payer: Medicare Other | Source: Ambulatory Visit | Attending: Radiation Oncology | Admitting: Radiation Oncology

## 2015-01-10 DIAGNOSIS — Z510 Encounter for antineoplastic radiation therapy: Secondary | ICD-10-CM | POA: Diagnosis not present

## 2015-01-10 DIAGNOSIS — C50919 Malignant neoplasm of unspecified site of unspecified female breast: Secondary | ICD-10-CM

## 2015-01-10 MED ORDER — SODIUM CHLORIDE 0.9 % IV SOLN
Freq: Once | INTRAVENOUS | Status: AC
  Administered 2015-01-10: 12:00:00 via INTRAVENOUS
  Filled 2015-01-10: qty 1000

## 2015-01-12 ENCOUNTER — Encounter: Payer: Self-pay | Admitting: Medical Oncology

## 2015-01-12 ENCOUNTER — Emergency Department: Payer: Medicare Other

## 2015-01-12 ENCOUNTER — Emergency Department
Admission: EM | Admit: 2015-01-12 | Discharge: 2015-01-12 | Disposition: A | Discharge status: Home / Self Care | Payer: Medicare Other | Attending: Emergency Medicine | Admitting: Emergency Medicine

## 2015-01-12 DIAGNOSIS — Y998 Other external cause status: Secondary | ICD-10-CM | POA: Insufficient documentation

## 2015-01-12 DIAGNOSIS — S0003XA Contusion of scalp, initial encounter: Secondary | ICD-10-CM | POA: Insufficient documentation

## 2015-01-12 DIAGNOSIS — Y9389 Activity, other specified: Secondary | ICD-10-CM | POA: Insufficient documentation

## 2015-01-12 DIAGNOSIS — S0990XA Unspecified injury of head, initial encounter: Secondary | ICD-10-CM | POA: Diagnosis present

## 2015-01-12 DIAGNOSIS — I10 Essential (primary) hypertension: Secondary | ICD-10-CM | POA: Insufficient documentation

## 2015-01-12 DIAGNOSIS — Y9289 Other specified places as the place of occurrence of the external cause: Secondary | ICD-10-CM | POA: Diagnosis not present

## 2015-01-12 DIAGNOSIS — W06XXXA Fall from bed, initial encounter: Secondary | ICD-10-CM | POA: Insufficient documentation

## 2015-01-12 DIAGNOSIS — Z79899 Other long term (current) drug therapy: Secondary | ICD-10-CM | POA: Diagnosis not present

## 2015-01-12 NOTE — ED Notes (Signed)
Pt from home via ems where pt reports that she sat up on the side of the bed and slipped off, hit rt side of head on her walker, denies LOC. Hematoma noted to side of head. Pt denies pain. Denies dizziness. Pt a/o x 4. Hx of brain CA with current radiation treatment. Last tx was Friday.

## 2015-01-12 NOTE — ED Provider Notes (Signed)
[facility] Emergency Department Provider Note  ____________________________________________  Time seen: 11:00am  I have reviewed the triage vital signs and the nursing notes.   HISTORY  Chief Complaint Fall    HPI Breanna Curtis is a 81 y.o. female with brain ca getting rad tx, who slipped off her bed, hit right head on wakler. No neck pain, vision change, n/t/w.  No syncope.  Feels fine but wanted to be checked out.  Pain in mild, constant. No blood thinners.     Past Medical History  Diagnosis Date  . Hypertension   . Hypercholesterolemia   . History of depression   . History of anxiety   . GERD (gastroesophageal reflux disease)   . DVT (deep venous thrombosis)   . Breast cancer     right breast ca-2011     Patient Active Problem List   Diagnosis Date Noted  . Breast cancer 09/06/2014     Past Surgical History  Procedure Laterality Date  . Mastectomy Right 2011    rt mastectomy-chemo/rad  . Breast cyst aspiration Left     lt fna-neg  . Breast biopsy Left     neg  . Cholecystectomy    . Appendectomy    . Cyst removed from rectum       Current Outpatient Rx  Name  Route  Sig  Dispense  Refill  . ALPRAZolam (XANAX) 0.25 MG tablet   Oral   Take 1 tablet (0.25 mg total) by mouth at bedtime as needed for anxiety.   30 tablet   2   . dexamethasone (DECADRON) 4 MG tablet   Oral   Take 2 tablets (8 mg total) by mouth 2 (two) times daily. Patient taking differently: Take 4 mg by mouth 2 (two) times daily.    60 tablet   0   . dexamethasone (DECADRON) 4 MG tablet   Oral   Take 1 tablet (4 mg total) by mouth daily.   9 tablet   0   . letrozole (FEMARA) 2.5 MG tablet   Oral   Take by mouth.         . losartan-hydrochlorothiazide (HYZAAR) 100-25 MG per tablet   Oral   Take by mouth.         . Multiple Vitamin (MULTIVITAMIN) capsule   Oral   Take by mouth.         Marland Kitchen omeprazole (PRILOSEC) 20 MG capsule    Oral   Take by mouth.         . sertraline (ZOLOFT) 50 MG tablet   Oral   Take by mouth.         . simvastatin (ZOCOR) 20 MG tablet   Oral   Take by mouth.         . sulfamethoxazole-trimethoprim (BACTRIM DS,SEPTRA DS) 800-160 MG per tablet   Oral   Take 1 tablet by mouth 2 (two) times daily.   14 tablet   2   . traZODone (DESYREL) 50 MG tablet   Oral   Take by mouth.            Allergies Review of patient's allergies indicates no known allergies.   Family History  Problem Relation Age of Onset  . Breast cancer Maternal Aunt 91  . Hypertension Mother   . Hyperlipidemia Mother   . Heart failure Mother   . Arthritis Mother   . Hypertension Sister   . Hyperlipidemia Sister   . COPD Sister  Social History Social History  Substance Use Topics  . Smoking status: Never Smoker   . Smokeless tobacco: None  . Alcohol Use: No    Review of Systems  Constitutional:   No fever or chills. No weight changes Eyes:   No blurry vision or double vision.  ENT:   No sore throat. Cardiovascular:   No chest pain. Respiratory:   No dyspnea or cough. Gastrointestinal:   Negative for abdominal pain, vomiting and diarrhea.  No BRBPR or melena. Genitourinary:   Negative for dysuria, urinary retention, bloody urine, or difficulty urinating. Musculoskeletal:   Negative for back pain. No joint swelling or pain. Skin:   Negative for rash. Neurological:   Positive ha as above, no focal weakness or numbness. Psychiatric:  No anxiety or depression.   Endocrine:  No hot/cold intolerance, changes in energy, or sleep difficulty.  10-point ROS otherwise negative.  ____________________________________________   PHYSICAL EXAM:  VITAL SIGNS: ED Triage Vitals  Enc Vitals Group     BP 01/12/15 1055 122/74 mmHg     Pulse Rate 01/12/15 1055 73     Resp 01/12/15 1055 17     Temp 01/12/15 1055 98 F (36.7 C)     Temp Source 01/12/15 1055 Oral     SpO2 01/12/15 1055 100 %      Weight 01/12/15 1055 114 lb (51.71 kg)     Height 01/12/15 1055 4\' 11"  (1.499 m)     Head Cir --      Peak Flow --      Pain Score 01/12/15 1055 0     Pain Loc --      Pain Edu? --      Excl. in [place]? --      Constitutional:   Alert and oriented. Well appearing and in no distress. Eyes:   No scleral icterus. No conjunctival pallor. PERRL. EOMI ENT   Head:   Normocephalic with small scalp hematoma R parietal area. .   Nose:   No congestion/rhinnorhea. No septal hematoma   Mouth/Throat:   MMM, no pharyngeal erythema. No peritonsillar mass. No uvula shift.   Neck:   No stridor. No SubQ emphysema. No meningismus. Hematological/Lymphatic/Immunilogical:   No cervical lymphadenopathy. Cardiovascular:   RRR. Normal and symmetric distal pulses are present in all extremities. No murmurs, rubs, or gallops. Respiratory:   Normal respiratory effort without tachypnea nor retractions. Breath sounds are clear and equal bilaterally. No wheezes/rales/rhonchi. Gastrointestinal:   Soft and nontender. No distention. There is no CVA tenderness.  No rebound, rigidity, or guarding. Genitourinary:   deferred Musculoskeletal:   Nontender with normal range of motion in all extremities. No joint effusions.  No lower extremity tenderness.  No edema. Neurologic:   Normal speech and language.  CN 2-10 normal. Motor grossly intact. No pronator drift.  Normal gait. No gross focal neurologic deficits are appreciated.  Skin:    Skin is warm, dry and intact. No rash noted.  No petechiae, purpura, or bullae. Psychiatric:   Mood and affect are normal. Speech and behavior are normal. Patient exhibits appropriate insight and judgment.  ____________________________________________    LABS (pertinent positives/negatives) (all labs ordered are listed, but only abnormal results are displayed) Labs Reviewed - No data to  display ____________________________________________   EKG    ____________________________________________    RADIOLOGY  Ct head negative.  ____________________________________________   PROCEDURES   ____________________________________________   INITIAL IMPRESSION / ASSESSMENT AND PLAN / ED COURSE  Pertinent labs & imaging results  that were available during my care of the patient were reviewed by me and considered in my medical decision making (see chart for details).  Pt p/w head trauma and focal hematoma in parietal area.  Increased risk of bleed due to rad. tx and ca.  Will CT head.   3:00pm - ct hegative.  Nad, wa, normal mental status. Will DC.      ____________________________________________   FINAL CLINICAL IMPRESSION(S) / ED DIAGNOSES  Final diagnoses:  Scalp contusion, initial encounter      Carrie Mew, MD 01/12/15 [phone]

## 2015-01-12 NOTE — Discharge Instructions (Signed)
Contusion °A contusion is a deep bruise. Contusions are the result of an injury that caused bleeding under the skin. The contusion may turn blue, purple, or yellow. Minor injuries will give you a painless contusion, but more severe contusions may stay painful and swollen for a few weeks.  °CAUSES  °A contusion is usually caused by a blow, trauma, or direct force to an area of the body. °SYMPTOMS  °· Swelling and redness of the injured area. °· Bruising of the injured area. °· Tenderness and soreness of the injured area. °· Pain. °DIAGNOSIS  °The diagnosis can be made by taking a history and physical exam. An X-ray, CT scan, or MRI may be needed to determine if there were any associated injuries, such as fractures. °TREATMENT  °Specific treatment will depend on what area of the body was injured. In general, the best treatment for a contusion is resting, icing, elevating, and applying cold compresses to the injured area. Over-the-counter medicines may also be recommended for pain control. Ask your caregiver what the best treatment is for your contusion. °HOME CARE INSTRUCTIONS  °· Put ice on the injured area. °· Put ice in a plastic bag. °· Place a towel between your skin and the bag. °· Leave the ice on for 15-20 minutes, 3-4 times a day, or as directed by your health care provider. °· Only take over-the-counter or prescription medicines for pain, discomfort, or fever as directed by your caregiver. Your caregiver may recommend avoiding anti-inflammatory medicines (aspirin, ibuprofen, and naproxen) for 48 hours because these medicines may increase bruising. °· Rest the injured area. °· If possible, elevate the injured area to reduce swelling. °SEEK IMMEDIATE MEDICAL CARE IF:  °· You have increased bruising or swelling. °· You have pain that is getting worse. °· Your swelling or pain is not relieved with medicines. °MAKE SURE YOU:  °· Understand these instructions. °· Will watch your condition. °· Will get help right  away if you are not doing well or get worse. °Document Released: 01/13/2005 Document Revised: 04/10/2013 Document Reviewed: 02/08/2011 °ExitCare® Patient Information ©2015 [employer]. This information is not intended to replace advice given to you by your health care provider. Make sure you discuss any questions you have with your health care provider. ° °Blunt Trauma °You have been evaluated for injuries. You have been examined and your caregiver has not found injuries serious enough to require hospitalization. °It is common to have multiple bruises and sore muscles following an accident. These tend to feel worse for the first 24 hours. You will feel more stiffness and soreness over the next several hours and worse when you wake up the first morning after your accident. After this point, you should begin to improve with each passing day. The amount of improvement depends on the amount of damage done in the accident. °Following your accident, if some part of your body does not work as it should, or if the pain in any area continues to increase, you should return to the Emergency Department for re-evaluation.  °HOME CARE INSTRUCTIONS  °Routine care for sore areas should include: °· Ice to sore areas every 2 hours for 20 minutes while awake for the next 2 days. °· Drink extra fluids (not alcohol). °· Take a hot or warm shower or bath once or twice a day to increase blood flow to sore muscles. This will help you "limber up". °· Activity as tolerated. Lifting may aggravate neck or back pain. °· Only take over-the-counter or prescription   medicines for pain, discomfort, or fever as directed by your caregiver. Do not use aspirin. This may increase bruising or increase bleeding if there are small areas where this is happening. °SEEK IMMEDIATE MEDICAL CARE IF: °· Numbness, tingling, weakness, or problem with the use of your arms or legs. °· A severe headache is not relieved with medications. °· There is a change in bowel  or bladder control. °· Increasing pain in any areas of the body. °· Short of breath or dizzy. °· Nauseated, vomiting, or sweating. °· Increasing belly (abdominal) discomfort. °· Blood in urine, stool, or vomiting blood. °· Pain in either shoulder in an area where a shoulder strap would be. °· Feelings of lightheadedness or if you have a fainting episode. °Sometimes it is not possible to identify all injuries immediately after the trauma. It is important that you continue to monitor your condition after the emergency department visit. If you feel you are not improving, or improving more slowly than should be expected, call your physician. If you feel your symptoms (problems) are worsening, return to the Emergency Department immediately. °Document Released: 12/30/2000 Document Revised: 06/28/2011 Document Reviewed: 11/22/2007 °ExitCare® Patient Information ©2015 [employer]. This information is not intended to replace advice given to you by your health care provider. Make sure you discuss any questions you have with your health care provider. ° °

## 2015-01-13 ENCOUNTER — Ambulatory Visit
Admission: RE | Admit: 2015-01-13 | Discharge: 2015-01-13 | Disposition: A | Discharge status: Home / Self Care | Payer: Medicare Other | Source: Ambulatory Visit | Attending: Radiation Oncology | Admitting: Radiation Oncology

## 2015-01-13 DIAGNOSIS — Z510 Encounter for antineoplastic radiation therapy: Secondary | ICD-10-CM | POA: Diagnosis not present

## 2015-01-15 NOTE — Patient Instructions (Signed)
Eribulin solution for injection What is this medicine? ERIBULIN is a chemotherapy drug. It is used to treat breast cancer. This medicine may be used for other purposes; ask your health care provider or pharmacist if you have questions. COMMON BRAND NAME(S): [place] What should I tell my health care provider before I take this medicine? They need to know if you have any of these conditions: -heart disease -kidney disease -liver disease -low blood counts, like low white cell, platelet, or red cell counts -an unusual or allergic reaction to eribulin, other medicines, foods, dyes, or preservatives -pregnant or trying to get pregnant -breast-feeding How should I use this medicine? This medicine is for infusion into a vein. It is given by a health care professional in a hospital or clinic setting. Talk to your pediatrician regarding the use of this medicine in children. Special care may be needed. Overdosage: If you think you've taken too much of this medicine contact a poison control center or emergency room at once. Overdosage: If you think you have taken too much of this medicine contact a poison control center or emergency room at once. NOTE: This medicine is only for you. Do not share this medicine with others. What if I miss a dose? It is important not to miss your dose. Call your doctor or health care professional if you are unable to keep an appointment. What may interact with this medicine? Do not take this medicine with any of the following medications: -amiodarone -astemizole -arsenic trioxide -bepridil -bretylium -chloroquine -chlorpromazine -cisapride -clarithromycin -dextromethorphan,  quinidine -disopyramide -dofetilide -droperidol -dronedarone -erythromycin -grepafloxacin -halofantrine -haloperidol -ibutilide -levomethadyl -mesoridazine -methadone -pentamidine -procainamide -quinidine -pimozide -posaconazole -probucol -propafenone -saquinavir -sotalol -sparfloxacin -terfenadine -thioridazine -troleandomycin -ziprasidone This list may not describe all possible interactions. Give your health care provider a list of all the medicines, herbs, non-prescription drugs, or dietary supplements you use. Also tell them if you smoke, drink alcohol, or use illegal drugs. Some items may interact with your medicine. What should I watch for while using this medicine? Your condition will be monitored carefully while you are receiving this medicine. This drug may make you feel generally unwell. This is not uncommon, as chemotherapy can affect healthy cells as well as cancer cells. Report any side effects. Continue your course of treatment even though you feel ill unless your doctor tells you to stop. Call your doctor or health care professional for advice if you get a fever, chills or sore throat, or other symptoms of a cold or flu. Do not treat yourself. This drug decreases your body's ability to fight infections. Try to avoid being around people who are sick. This medicine may increase your risk to bruise or bleed. Call your doctor or health care professional if you notice any unusual bleeding. Be careful brushing and flossing your teeth or using a toothpick because you may get an infection or bleed more easily. If you have any dental work done, tell your dentist you are receiving this medicine. Avoid taking products that contain aspirin, acetaminophen, ibuprofen, naproxen, or ketoprofen unless instructed by your doctor. These medicines may hide a fever. Do not become pregnant while taking this medicine. Women should inform their doctor if they wish to become pregnant or think  they might be pregnant. There is a potential for serious side effects to an unborn child. Talk to your health care professional or pharmacist for more information. Do not breast-feed an infant while taking this medicine. What side effects may I notice from receiving this medicine?   Side effects that you should report to your doctor or health care professional as soon as possible: -allergic reactions like skin rash, itching or hives, swelling of the face, lips, or tongue -low blood counts - this medicine may decrease the number of white blood cells, red blood cells and platelets. You may be at increased risk for infections and bleeding. -signs of infection - fever or chills, cough, sore throat, pain or difficulty passing urine -signs of decreased platelets or bleeding - bruising, pinpoint red spots on the skin, black, tarry stools, blood in the urine -signs of decreased red blood cells - unusually weak or tired, fainting spells, lightheadedness -pain, tingling, numbness in the hands or feet Side effects that usually do not require medical attention (Report these to your doctor or health care professional if they continue or are bothersome.): -constipation -hair loss -headache -loss of appetite -muscle or joint pain -nausea, vomiting -stomach pain This list may not describe all possible side effects. Call your doctor for medical advice about side effects. You may report side effects to FDA at 1-800-FDA-1088. Where should I keep my medicine? This drug is given in a hospital or clinic and will not be stored at home. NOTE: This sheet is a summary. It may not cover all possible information. If you have questions about this medicine, talk to your doctor, pharmacist, or health care provider.  2015, [employer]. (2009-03-27 23:04:37)  

## 2015-01-16 ENCOUNTER — Ambulatory Visit: Payer: Medicare Other

## 2015-01-17 ENCOUNTER — Telehealth: Payer: Self-pay | Admitting: *Deleted

## 2015-01-17 ENCOUNTER — Inpatient Hospital Stay: Payer: Medicare Other

## 2015-01-17 ENCOUNTER — Ambulatory Visit: Payer: Medicare Other

## 2015-01-17 ENCOUNTER — Ambulatory Visit (HOSPITAL_BASED_OUTPATIENT_CLINIC_OR_DEPARTMENT_OTHER): Payer: Medicare Other | Admitting: Oncology

## 2015-01-17 VITALS — BP 104/59 | HR 86 | Temp 97.9°F | Resp 16 | Wt 109.1 lb

## 2015-01-17 DIAGNOSIS — C7951 Secondary malignant neoplasm of bone: Secondary | ICD-10-CM | POA: Diagnosis not present

## 2015-01-17 DIAGNOSIS — R5381 Other malaise: Secondary | ICD-10-CM

## 2015-01-17 DIAGNOSIS — C7931 Secondary malignant neoplasm of brain: Secondary | ICD-10-CM

## 2015-01-17 DIAGNOSIS — Z923 Personal history of irradiation: Secondary | ICD-10-CM | POA: Diagnosis not present

## 2015-01-17 DIAGNOSIS — Z170 Estrogen receptor positive status [ER+]: Secondary | ICD-10-CM

## 2015-01-17 DIAGNOSIS — R918 Other nonspecific abnormal finding of lung field: Secondary | ICD-10-CM

## 2015-01-17 DIAGNOSIS — Z79811 Long term (current) use of aromatase inhibitors: Secondary | ICD-10-CM

## 2015-01-17 DIAGNOSIS — K6289 Other specified diseases of anus and rectum: Secondary | ICD-10-CM

## 2015-01-17 DIAGNOSIS — C50919 Malignant neoplasm of unspecified site of unspecified female breast: Secondary | ICD-10-CM | POA: Diagnosis not present

## 2015-01-17 DIAGNOSIS — R5383 Other fatigue: Secondary | ICD-10-CM

## 2015-01-17 DIAGNOSIS — R1909 Other intra-abdominal and pelvic swelling, mass and lump: Secondary | ICD-10-CM

## 2015-01-17 DIAGNOSIS — R531 Weakness: Secondary | ICD-10-CM

## 2015-01-17 LAB — COMPREHENSIVE METABOLIC PANEL
ALBUMIN: 3.2 g/dL — AB (ref 3.5–5.0)
ALT: 34 U/L (ref 14–54)
ANION GAP: 11 (ref 5–15)
AST: 36 U/L (ref 15–41)
Alkaline Phosphatase: 96 U/L (ref 38–126)
BILIRUBIN TOTAL: 1.2 mg/dL (ref 0.3–1.2)
BUN: 15 mg/dL (ref 6–20)
CHLORIDE: 92 mmol/L — AB (ref 101–111)
CO2: 32 mmol/L (ref 22–32)
Calcium: 9.6 mg/dL (ref 8.9–10.3)
Creatinine, Ser: 0.61 mg/dL (ref 0.44–1.00)
GFR calc Af Amer: >60 mL/min (ref >60)
GFR calc non Af Amer: >60 mL/min (ref >60)
GLUCOSE: 99 mg/dL (ref 65–99)
POTASSIUM: 2.5 mmol/L — AB (ref 3.5–5.1)
Sodium: 135 mmol/L (ref 135–145)
TOTAL PROTEIN: 6.7 g/dL (ref 6.5–8.1)

## 2015-01-17 LAB — CBC WITH DIFFERENTIAL/PLATELET
BASOS ABS: 0.1 10*3/uL (ref 0–0.1)
BASOS PCT: 1 %
EOS ABS: 0 10*3/uL (ref 0–0.7)
EOS PCT: 1 %
HCT: 38.4 % (ref 35.0–47.0)
Hemoglobin: 13.1 g/dL (ref 12.0–16.0)
Lymphocytes Relative: 25 %
Lymphs Abs: 1.2 10*3/uL (ref 1.0–3.6)
MCH: 30.7 pg (ref 26.0–34.0)
MCHC: 34 g/dL (ref 32.0–36.0)
MCV: 90.3 fL (ref 80.0–100.0)
MONO ABS: 0.5 10*3/uL (ref 0.2–0.9)
MONOS PCT: 11 %
Neutro Abs: 2.8 10*3/uL (ref 1.4–6.5)
Neutrophils Relative %: 62 %
PLATELETS: 218 10*3/uL (ref 150–440)
RBC: 4.25 MIL/uL (ref 3.80–5.20)
RDW: 15.5 % — AB (ref 11.5–14.5)
WBC: 4.6 10*3/uL (ref 3.6–11.0)

## 2015-01-17 MED ORDER — PROCHLORPERAZINE MALEATE 10 MG PO TABS: 10.0000 mg | ORAL_TABLET | Freq: Four times a day (QID) | ORAL | Status: DC | PRN

## 2015-01-17 MED ORDER — ONDANSETRON HCL 8 MG PO TABS: 8.0000 mg | ORAL_TABLET | Freq: Two times a day (BID) | ORAL | Status: DC

## 2015-01-17 MED ORDER — LIDOCAINE-PRILOCAINE 2.5-2.5 % EX CREA: TOPICAL_CREAM | CUTANEOUS | Status: DC

## 2015-01-17 NOTE — Progress Notes (Signed)
Patient is having a lot of weakness and not getting out of bed very much.  Not eating much because she feels nausea every time she eats and not able to tolerate the meal supplement shakes.  Had a recent fall because of the leg weakness and was evaluated at the ER.  Her daughters are with her today wanting to discuss if receiving treatment is the best at this time.

## 2015-01-17 NOTE — Telephone Encounter (Signed)
Critical K+ 2.5, MD notified.

## 2015-01-17 NOTE — Progress Notes (Signed)
[place]  Telephone:([phone]) [phone] Fax:(336) [phone]  ID: Breanna Curtis OB: 17-May-1933  MR#: [id number]  OIZ#:[id number]  Patient Care Team: Kirk Ruths, MD as PCP - General (Internal Medicine) Manya Silvas, MD (Gastroenterology)  CHIEF COMPLAINT:  Chief Complaint  Patient presents with  . Breast Cancer    INTERVAL HISTORY: Patient returns for further evaluation and consideration of initiation of chemotherapy. She has now completed XRT to her brain. She continues to have increased weakness and fatigue and her performance status continues to decline. She has a poor appetite. She does not complain of any neurologic complaints today. She denies any pain. She has no chest pain or shortness of breath. She denies any nausea, vomiting, constipation, or diarrhea. She has no urinary complaints. Patient offers no further specific complaints.   REVIEW OF SYSTEMS:   Review of Systems  Constitutional: Positive for malaise/fatigue. Negative for fever and weight loss.  Eyes: Negative.   Respiratory: Negative.   Cardiovascular: Negative.   Musculoskeletal: Negative.   Neurological: Positive for weakness. Negative for headaches.    As per HPI. Otherwise, a complete review of systems is negatve.  PAST MEDICAL HISTORY: Past Medical History  Diagnosis Date  . Hypertension   . Hypercholesterolemia   . History of depression   . History of anxiety   . GERD (gastroesophageal reflux disease)   . DVT (deep venous thrombosis)   . Breast cancer     right breast ca-2011    PAST SURGICAL HISTORY: Past Surgical History  Procedure Laterality Date  . Mastectomy Right 2011    rt mastectomy-chemo/rad  . Breast cyst aspiration Left     lt fna-neg  . Breast biopsy Left     neg  . Cholecystectomy    . Appendectomy    . Cyst removed from rectum      FAMILY HISTORY Family History  Problem Relation Age of Onset  . Breast cancer Maternal Aunt 67  .  Hypertension Mother   . Hyperlipidemia Mother   . Heart failure Mother   . Arthritis Mother   . Hypertension Sister   . Hyperlipidemia Sister   . COPD Sister        ADVANCED DIRECTIVES:    HEALTH MAINTENANCE: Social History  Substance Use Topics  . Smoking status: Never Smoker   . Smokeless tobacco: Not on file  . Alcohol Use: No     Colonoscopy:  PAP:  Bone density:  Lipid panel:  No Known Allergies  Current Outpatient Prescriptions  Medication Sig Dispense Refill  . ALPRAZolam (XANAX) 0.25 MG tablet Take 1 tablet (0.25 mg total) by mouth at bedtime as needed for anxiety. 30 tablet 2  . dexamethasone (DECADRON) 4 MG tablet Take 1 tablet (4 mg total) by mouth daily. 9 tablet 0  . letrozole (FEMARA) 2.5 MG tablet Take by mouth.    . lidocaine-prilocaine (EMLA) cream Apply to affected area once 30 g 3  . losartan-hydrochlorothiazide (HYZAAR) 100-25 MG per tablet Take by mouth.    . Multiple Vitamin (MULTIVITAMIN) capsule Take by mouth.    Marland Kitchen omeprazole (PRILOSEC) 20 MG capsule Take by mouth.    . ondansetron (ZOFRAN) 8 MG tablet Take 1 tablet (8 mg total) by mouth 2 (two) times daily. Start the day after chemo for 2 days. Then take as needed for nausea or vomiting. 30 tablet 1  . prochlorperazine (COMPAZINE) 10 MG tablet Take 1 tablet (10 mg total) by mouth every 6 (six) hours as needed (  Nausea or vomiting). 30 tablet 1  . sertraline (ZOLOFT) 50 MG tablet Take by mouth.    . simvastatin (ZOCOR) 20 MG tablet Take by mouth.    . traZODone (DESYREL) 50 MG tablet Take by mouth.     No current facility-administered medications for this visit.    OBJECTIVE: Filed Vitals:   01/17/15 1003  BP: 104/59  Pulse: 86  Temp: 97.9 F (36.6 C)  Resp: 16     Body mass index is 22.03 kg/(m^2).    ECOG FS:3 - Symptomatic, >50% confined to bed  General: Ill-appearing, no acute distress. Eyes: Pink conjunctiva, anicteric sclera. Breasts: Right mastectomy. Exam deferred today. Lungs:  Clear to auscultation bilaterally. Heart: Regular rate and rhythm. No rubs, murmurs, or gallops. Abdomen: Soft, nontender, nondistended. No organomegaly noted, normoactive bowel sounds. Musculoskeletal: No edema, cyanosis, or clubbing. Neuro: Alert, answering all questions appropriately. Cranial nerves grossly intact. Skin: No rashes or petechiae noted. Psych: Normal affect.   LAB RESULTS:  Lab Results  Component Value Date   NA 135 01/17/2015   K 2.5* 01/17/2015   CL 92* 01/17/2015   CO2 32 01/17/2015   GLUCOSE 99 01/17/2015   BUN 15 01/17/2015   CREATININE 0.61 01/17/2015   CALCIUM 9.6 01/17/2015   PROT 6.7 01/17/2015   ALBUMIN 3.2* 01/17/2015   AST 36 01/17/2015   ALT 34 01/17/2015   ALKPHOS 96 01/17/2015   BILITOT 1.2 01/17/2015   GFRNONAA >60 01/17/2015   GFRAA >60 01/17/2015    Lab Results  Component Value Date   WBC 4.6 01/17/2015   NEUTROABS 2.8 01/17/2015   HGB 13.1 01/17/2015   HCT 38.4 01/17/2015   MCV 90.3 01/17/2015   PLT 218 01/17/2015     STUDIES: Ct Head Wo Contrast  01/12/2015   CLINICAL DATA:  Patient status post fall. Hit the right side of head. No reported loss of consciousness.  EXAM: CT HEAD WITHOUT CONTRAST  TECHNIQUE: Contiguous axial images were obtained from the base of the skull through the vertex without intravenous contrast.  COMPARISON:  MRI brain 12/16/2014  FINDINGS: Ventricles and sulci are appropriate for patient's age. Periventricular and subcortical white matter hypodensity compatible with chronic small vessel ischemic changes. No evidence for acute cortically based infarct, intracranial hemorrhage, mass lesion or mass effect. Re- demonstrated soft tissue within the orbits bilaterally compatible with history of metastatic disease. Multiple patchy lucencies demonstrated throughout the calvarium compatible with osseous metastatic disease.  IMPRESSION: No acute intracranial process.  Periorbital and osseous metastatic disease.    Electronically Signed   By: Lovey Newcomer M.D.   On: 01/12/2015 11:23   Ct Chest W Contrast  12/20/2014   CLINICAL DATA:  Right breast cancer. Prior mastectomy, chemotherapy, and radiation therapy in 2011. Restaging/surveillance. Preprocedural for radiation therapy next week for brain metastatic disease. Osseous metastatic disease on bone scan yesterday.  EXAM: CT CHEST, ABDOMEN, AND PELVIS WITH CONTRAST  TECHNIQUE: Multidetector CT imaging of the chest, abdomen and pelvis was performed following the standard protocol during bolus administration of intravenous contrast.  CONTRAST:  136mL OMNIPAQUE IOHEXOL 300 MG/ML  SOLN  COMPARISON:  Multiple exams, including 12/19/2014 and 12/17/2010  FINDINGS: CT CHEST FINDINGS  Mediastinum/Nodes: Hypodense 9 mm left thyroid nodule.  Moderate cardiomegaly. Distal esophageal wall thickening, mild. Small to moderate-sized hiatal hernia.  Small mediastinal lymph nodes are not pathologically enlarged. Right mastectomy. Right subpectoral node 5 mm in short axis on image 13 series 2, previously 3 mm.  Lungs/Pleura: Trace right pleural  effusion. Mild infiltration of the right anterior pericardial adipose tissue, image 37 series 2. Pleural thickening anteriorly adjacent to the right middle lobe, images 37-39 series 2.  Mild scarring in the left lower lobe. 3 mm left upper lobe nodule, image 14 series 4, no change from 2012, considered benign.  Musculoskeletal: Severe bony demineralization. Healing right posterolateral ninth rib fracture, image 44 series 4. Healing fracture of the left sixth rib the rib on image 30 series 4, possibly pathologic. Extensive thoracic spondylosis and demineralization. Lucency in the base of the T11 spinous process, image 87 series 6. Lytic lesion in the right posterior vertebral body and pedicle at T10, image 80 series 6.  CT ABDOMEN PELVIS FINDINGS  Hepatobiliary: Chronic pneumobilia. Lateral segment left hepatic lobe cyst, image 45 series 2, chronic.  Cholecystectomy. Equivocally nodular liver contour.  Pancreas: Unremarkable  Spleen: 2.0 by 1.0 cm hypodense lesion medially in the dome of the spleen, image 38 series 2.  Adrenals/Urinary Tract: Bilateral hydronephrosis with distended collecting systems. Mild proximal hydroureter with more striking left distal hydroureter. Poor definition of the ureters distally. Wall thickening of the urinary bladder.  Stomach/Bowel: Wall thickening in the stomach antrum may be due to contraction but is technically nonspecific. Mild prominence of stool in the colon. Prominent wall thickening in the rectum extending from the rectosigmoid junction to the lower rectum.  Vascular/Lymphatic: Mild aortoiliac atherosclerosis. Venous varicosities along the lower abdominal wall and subcutaneous tissues superficial to the pubis. Small external iliac lymph nodes are not pathologically enlarged.  Reproductive: Amorphous soft tissue densities are present along both adnexa. These are enlarged compared to 10/23/2009 and concerning for tumor deposits. Considerable indistinctness of surrounding tissue planes, tracking along the pelvic side walls. Calcifications in the uterus likely from prior fibroids.  Other: Moderate scattered ascites. Fluid infiltration of the omentum with some mutlifocality/irregularity. Fluid infiltration of the mesentery. Tumor infiltration of the mesentery and omentum is a distinct possibility. Presacral edema noted.  Musculoskeletal: Bony demineralization. This may obscure metastatic lesions. Superior endplate compression fracture at L5 with grade 1 degenerative anterolisthesis at L5-S1. Vertical lucency along the right side of the L4 vertebral body on images 78-79 of series 6 could be from a nondisplaced fracture or simply an irregular spur. Old superior endplate compression fracture at L3. Multilevel degenerative disc disease in the lumbar spine.  IMPRESSION: 1. Marked wall thickening in portions of the rectum such that  rectal tumor is not excluded. Increase in soft tissue density along the adnexa/ovaries compared to prior, metastatic deposits in this vicinity are not excluded. There is retroperitoneal, pelvic sidewall, mesenteric, and omental edema with enhancing margins of the ureters, and likely some degree of distal ureteral obstruction causing the bilateral hydronephrosis and ureteral dilatation and enhancement. Peritoneal spread of malignancy is not excluded given this constellation of findings. 2. Osseous metastatic disease to the T10 vertebra, potentially the T11 spinous process, and ribs. 3. Superior endplate compression fracture at L5. Possible nondisplaced right-sided cortical fracture of the L4 vertebral body. 4. Equivocally nodular liver contour, query cirrhosis. 5. Additional findings at individual levels are as follows: Cardiomegaly. Distal esophageal wall thickening, query esophagitis. Small to moderate hiatal hernia. Trace right pleural effusion with some anterior right pleural irregularity. Prominent bony demineralization. Several healing rib fractures, 1 of which may be pathologic. Chronic pneumobilia. 2.0 by 1.0 cm nonspecific hypodense lesion medially in the dome of the spleen, merits observation. Atherosclerosis. Venous varicosities along the lower abdominal wall subcutaneous tissues. Multilevel degenerative disc disease.   Electronically  Signed   By: Van Clines M.D.   On: 12/20/2014 16:31   Nm Bone Scan Whole Body  12/19/2014   CLINICAL DATA:  History of right breast malignancy, asymptomatic.  EXAM: NUCLEAR MEDICINE WHOLE BODY BONE SCAN  TECHNIQUE: Whole body anterior and posterior images were obtained approximately 3 hours after intravenous injection of radiopharmaceutical.  RADIOPHARMACEUTICALS:  23.26 mCi Technetium-85m MDP IV  COMPARISON:  MRI of the head of December 16, 2014  FINDINGS: There is adequate uptake of the radiopharmaceutical by the skeleton. There is intensely increased uptake within  both kidneys. There is dilation of the midportion of the left ureter. The right ureter is not significantly dilated. Bladder activity is visible.  There are patchy areas of increased uptake in the posterior parietal bone on the right and in the left frontal bone. A focus of increased uptake in the midline at the basiocciput is visible.  There is mildly increased uptake in the lateral masses of the cervical spine. There is increased uptake within approximately T4 and T5 an approximately T9 or T10. There is mildly increased uptake at approximately L3 and at L5-S1.  There are multiple areas of increased uptake within posterior ribs bilaterally.  There is intensely increased uptake in the first carpometacarpal joint on the right.  Uptake within the left acetabulum is mildly increased. Activity elsewhere in the pelvis is unremarkable. Increased uptake about the knees and in the ankles and feet is likely degenerative in nature.  IMPRESSION: 1. Radiopharmaceutical uptake pattern consistent with metastatic disease to the calvarium, thoracic and lumbar spine, bilateral ribs, and possibly left acetabulum. 2. Retention of the radiopharmaceutical within both kidneys possibly related to obstruction given the appearance of the dilated left ureter and mild prominence of the proximal right ureter. Abdominal and pelvic CT scanning is recommended.   Electronically Signed   By: David  Martinique M.D.   On: 12/19/2014 14:55   Ct Abdomen Pelvis W Contrast  12/20/2014   CLINICAL DATA:  Right breast cancer. Prior mastectomy, chemotherapy, and radiation therapy in 2011. Restaging/surveillance. Preprocedural for radiation therapy next week for brain metastatic disease. Osseous metastatic disease on bone scan yesterday.  EXAM: CT CHEST, ABDOMEN, AND PELVIS WITH CONTRAST  TECHNIQUE: Multidetector CT imaging of the chest, abdomen and pelvis was performed following the standard protocol during bolus administration of intravenous contrast.   CONTRAST:  154mL OMNIPAQUE IOHEXOL 300 MG/ML  SOLN  COMPARISON:  Multiple exams, including 12/19/2014 and 12/17/2010  FINDINGS: CT CHEST FINDINGS  Mediastinum/Nodes: Hypodense 9 mm left thyroid nodule.  Moderate cardiomegaly. Distal esophageal wall thickening, mild. Small to moderate-sized hiatal hernia.  Small mediastinal lymph nodes are not pathologically enlarged. Right mastectomy. Right subpectoral node 5 mm in short axis on image 13 series 2, previously 3 mm.  Lungs/Pleura: Trace right pleural effusion. Mild infiltration of the right anterior pericardial adipose tissue, image 37 series 2. Pleural thickening anteriorly adjacent to the right middle lobe, images 37-39 series 2.  Mild scarring in the left lower lobe. 3 mm left upper lobe nodule, image 14 series 4, no change from 2012, considered benign.  Musculoskeletal: Severe bony demineralization. Healing right posterolateral ninth rib fracture, image 44 series 4. Healing fracture of the left sixth rib the rib on image 30 series 4, possibly pathologic. Extensive thoracic spondylosis and demineralization. Lucency in the base of the T11 spinous process, image 87 series 6. Lytic lesion in the right posterior vertebral body and pedicle at T10, image 80 series 6.  CT ABDOMEN PELVIS FINDINGS  Hepatobiliary: Chronic pneumobilia. Lateral segment left hepatic lobe cyst, image 45 series 2, chronic. Cholecystectomy. Equivocally nodular liver contour.  Pancreas: Unremarkable  Spleen: 2.0 by 1.0 cm hypodense lesion medially in the dome of the spleen, image 38 series 2.  Adrenals/Urinary Tract: Bilateral hydronephrosis with distended collecting systems. Mild proximal hydroureter with more striking left distal hydroureter. Poor definition of the ureters distally. Wall thickening of the urinary bladder.  Stomach/Bowel: Wall thickening in the stomach antrum may be due to contraction but is technically nonspecific. Mild prominence of stool in the colon. Prominent wall thickening  in the rectum extending from the rectosigmoid junction to the lower rectum.  Vascular/Lymphatic: Mild aortoiliac atherosclerosis. Venous varicosities along the lower abdominal wall and subcutaneous tissues superficial to the pubis. Small external iliac lymph nodes are not pathologically enlarged.  Reproductive: Amorphous soft tissue densities are present along both adnexa. These are enlarged compared to 10/23/2009 and concerning for tumor deposits. Considerable indistinctness of surrounding tissue planes, tracking along the pelvic side walls. Calcifications in the uterus likely from prior fibroids.  Other: Moderate scattered ascites. Fluid infiltration of the omentum with some mutlifocality/irregularity. Fluid infiltration of the mesentery. Tumor infiltration of the mesentery and omentum is a distinct possibility. Presacral edema noted.  Musculoskeletal: Bony demineralization. This may obscure metastatic lesions. Superior endplate compression fracture at L5 with grade 1 degenerative anterolisthesis at L5-S1. Vertical lucency along the right side of the L4 vertebral body on images 78-79 of series 6 could be from a nondisplaced fracture or simply an irregular spur. Old superior endplate compression fracture at L3. Multilevel degenerative disc disease in the lumbar spine.  IMPRESSION: 1. Marked wall thickening in portions of the rectum such that rectal tumor is not excluded. Increase in soft tissue density along the adnexa/ovaries compared to prior, metastatic deposits in this vicinity are not excluded. There is retroperitoneal, pelvic sidewall, mesenteric, and omental edema with enhancing margins of the ureters, and likely some degree of distal ureteral obstruction causing the bilateral hydronephrosis and ureteral dilatation and enhancement. Peritoneal spread of malignancy is not excluded given this constellation of findings. 2. Osseous metastatic disease to the T10 vertebra, potentially the T11 spinous process, and  ribs. 3. Superior endplate compression fracture at L5. Possible nondisplaced right-sided cortical fracture of the L4 vertebral body. 4. Equivocally nodular liver contour, query cirrhosis. 5. Additional findings at individual levels are as follows: Cardiomegaly. Distal esophageal wall thickening, query esophagitis. Small to moderate hiatal hernia. Trace right pleural effusion with some anterior right pleural irregularity. Prominent bony demineralization. Several healing rib fractures, 1 of which may be pathologic. Chronic pneumobilia. 2.0 by 1.0 cm nonspecific hypodense lesion medially in the dome of the spleen, merits observation. Atherosclerosis. Venous varicosities along the lower abdominal wall subcutaneous tissues. Multilevel degenerative disc disease.   Electronically Signed   By: Van Clines M.D.   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  PLAN:    1.  Breast cancer: Imaging results reviewed independently and reported as above with widespread metastatic disease including brain and bone metastasis. Patient last received chemotherapy in September 2011. Patient has now completed radiation to her brain. Given her declining performance status and after lengthy discussion with patient and her daughters she has agreed to not initiate chemotherapy and enroll in hospice care. No intervention is needed at this time. No follow-up has been scheduled.  2. Brain metastasis: Patient has now completed  XRT.  3. Rectal lesion/adnexal masses: Hospice as above, no further intervention needed.  Approximately 30 minutes was spent in discussion and consultation.  Patient expressed understanding and was in agreement with this plan. She also understands that She can call clinic at any time with any questions, concerns, or complaints.   Breast cancer   Staging form: Breast, AJCC 7th Edition     Clinical  stage from 12/22/2014: Stage IV (T1c, N3, M1) - Signed by Lloyd Huger, MD on 12/22/2014   Lloyd Huger, MD   01/17/2015 12:55 PM

## 2015-01-18 LAB — CANCER ANTIGEN 27.29: CA 27.29: 237.6 U/mL — ABNORMAL HIGH (ref 0.0–38.6)

## 2015-01-21 ENCOUNTER — Telehealth: Payer: Self-pay | Admitting: *Deleted

## 2015-01-21 MED ORDER — ALPRAZOLAM 0.25 MG PO TABS: 0.2500 mg | ORAL_TABLET | Freq: Three times a day (TID) | ORAL | Status: AC | PRN

## 2015-01-21 MED ORDER — HYDROCODONE-ACETAMINOPHEN 5-325 MG PO TABS: 1.0000 | ORAL_TABLET | Freq: Four times a day (QID) | ORAL | Status: AC | PRN

## 2015-01-21 NOTE — Telephone Encounter (Signed)
Change xanax to tid and add Norco 5/325 mg q 6 h prn. Sonia Baller notified of this

## 2015-01-21 NOTE — Telephone Encounter (Signed)
Asking if hs xanax can be taken during day for her nerves. Also having pain not controlled by Tylenol ( she cannot swallow big pills)

## 2015-01-30 DIAGNOSIS — C50911 Malignant neoplasm of unspecified site of right female breast: Secondary | ICD-10-CM | POA: Diagnosis not present

## 2015-01-30 DIAGNOSIS — C7951 Secondary malignant neoplasm of bone: Secondary | ICD-10-CM | POA: Diagnosis not present

## 2015-01-30 DIAGNOSIS — C7931 Secondary malignant neoplasm of brain: Secondary | ICD-10-CM | POA: Diagnosis not present

## 2015-02-05 ENCOUNTER — Telehealth: Payer: Self-pay | Admitting: *Deleted

## 2015-02-05 NOTE — Telephone Encounter (Signed)
Opened in error

## 2015-02-07 ENCOUNTER — Inpatient Hospital Stay: Payer: Medicare Other | Attending: Oncology

## 2015-02-07 ENCOUNTER — Ambulatory Visit: Payer: Self-pay | Admitting: Oncology

## 2015-02-07 ENCOUNTER — Inpatient Hospital Stay: Payer: Medicare Other | Admitting: Oncology

## 2015-02-07 ENCOUNTER — Other Ambulatory Visit: Payer: Self-pay

## 2015-02-10 ENCOUNTER — Ambulatory Visit: Payer: Medicare Other | Admitting: Radiation Oncology

## 2015-02-18 DEATH — deceased

## 2015-10-08 ENCOUNTER — Other Ambulatory Visit: Payer: Self-pay | Admitting: Nurse Practitioner

## 2016-08-31 IMAGING — NM NM BONE WHOLE BODY
2 series · 12 of 12 positions shown · non-contrast
Comparison: MRI of the head December 16, 2014

CLINICAL DATA: History of right breast malignancy, asymptomatic.

EXAM:
NUCLEAR MEDICINE WHOLE BODY BONE SCAN
TECHNIQUE: Whole body anterior and posterior images were obtained approximately
3 hours after intravenous injection of radiopharmaceutical.
RADIOPHARMACEUTICALS:  23.26 mCi Yechnetium-TTm MDP IV

[Series 1000: 3 hr wholebody · 2.40mm/px · 2 of 2 frames shown]
[frame 1/2]
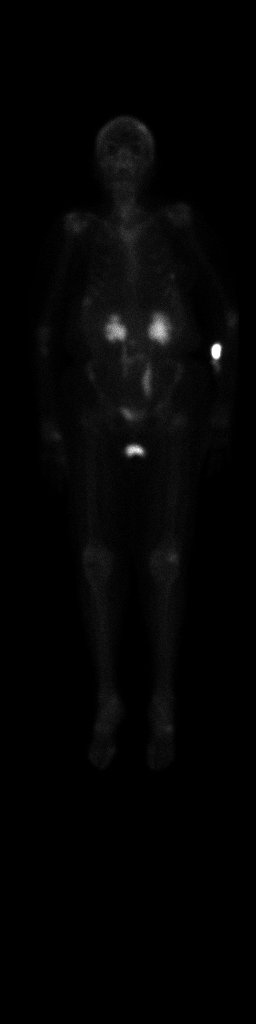
[frame 2/2]
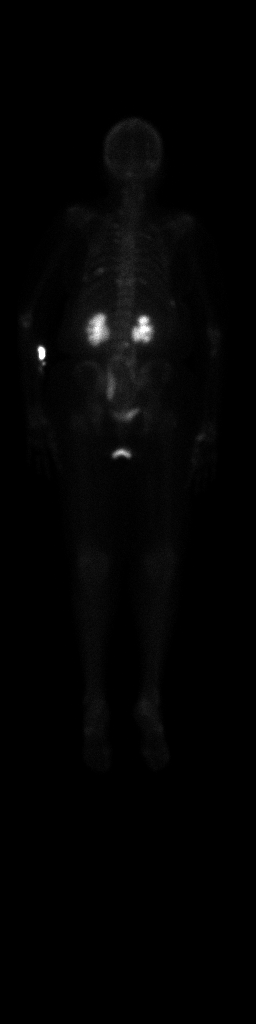

[Series 1000: statics · 2.40mm/px · 5 acquisitions, 10 frames shown]
[im 1/5]
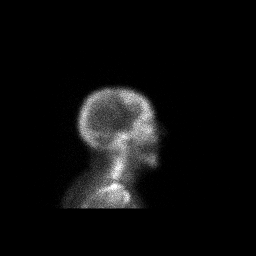
[im 1/5]
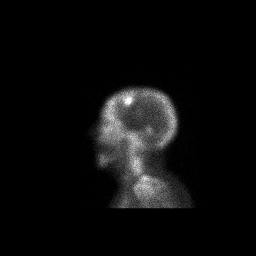
[im 2/5]
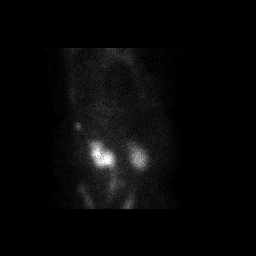
[im 2/5]
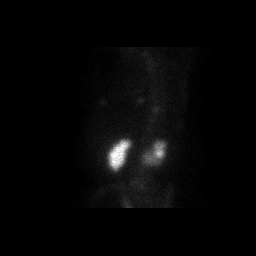
[im 3/5]
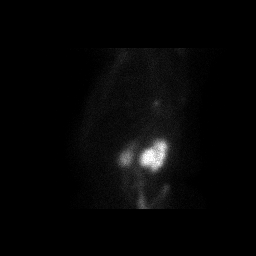
[im 3/5]
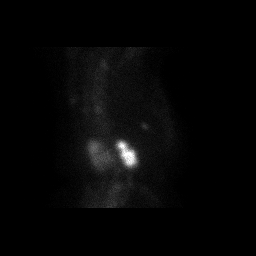
[im 4/5]
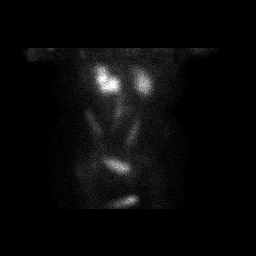
[im 4/5]
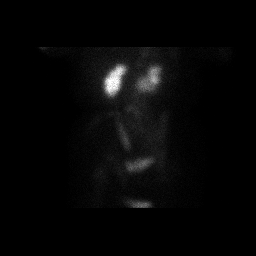
[im 5/5]
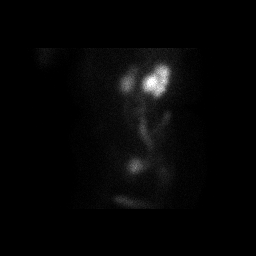
[im 5/5]
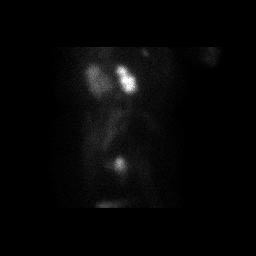

[12 of 12 positions shown; findings below may reference images not displayed]

FINDINGS: There is adequate uptake of the radiopharmaceutical by the skeleton.
There is intensely increased uptake within both kidneys. There is
dilation of the midportion of the left ureter. The right ureter is
not significantly dilated. Bladder activity is visible.

There are patchy areas of increased uptake in the posterior parietal
bone on the right and in the left frontal bone. A focus of increased
uptake in the midline at the basiocciput is visible.

There is mildly increased uptake in the lateral masses of the
cervical spine. There is increased uptake within approximately T4
and T5 an approximately T9 or T10. There is mildly increased uptake
at approximately L3 and at L5-S1.

There are multiple areas of increased uptake within posterior ribs
bilaterally.

There is intensely increased uptake in the first carpometacarpal
joint on the right.

Uptake within the left acetabulum is mildly increased. Activity
elsewhere in the pelvis is unremarkable. Increased uptake about the
knees and in the ankles and feet is likely degenerative in nature.
IMPRESSION: 1. Radiopharmaceutical uptake pattern consistent with metastatic
disease to the calvarium, thoracic and lumbar spine, bilateral ribs,
and possibly left acetabulum.
2. Retention of the radiopharmaceutical within both kidneys possibly
related to obstruction given the appearance of the dilated left
ureter and mild prominence of the proximal right ureter. Abdominal
and pelvic CT scanning is recommended.

## 2016-09-01 IMAGING — CT CT CHEST W/ CM
2 of 5 series · 12 of 46 positions shown, 14 images · IV contrast (omnipaque)
Comparison: Multiple exams, including 12/19/2014 and 12/17/2010

CLINICAL DATA: Right breast cancer. Prior mastectomy, chemotherapy,
and radiation therapy in 3344. Restaging/surveillance. Preprocedural
Osseous metastatic disease on bone scan yesterday.

EXAM:
CT CHEST, ABDOMEN, AND PELVIS WITH CONTRAST
TECHNIQUE: Multidetector CT imaging of the chest, abdomen and pelvis was
performed following the standard protocol during bolus
administration of intravenous contrast.
CONTRAST:  100mL OMNIPAQUE IOHEXOL 300 MG/ML  SOLN

[Series 2: cap with · axial · 0.55mm/px · z∈[-956,-461]mm · 9 of 113 slices shown, 11 images]
[im 7/113  soft-tissue]
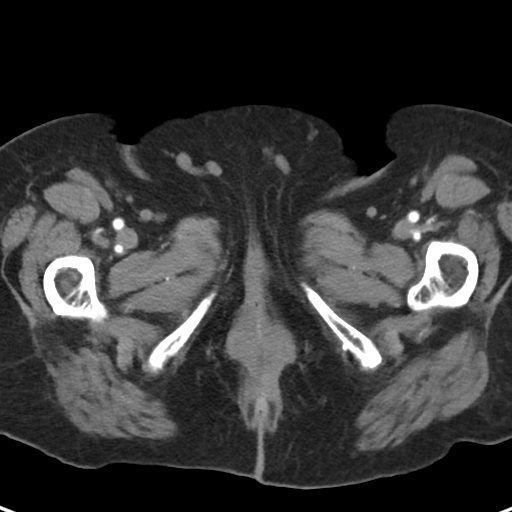
[im 7/113  bone]
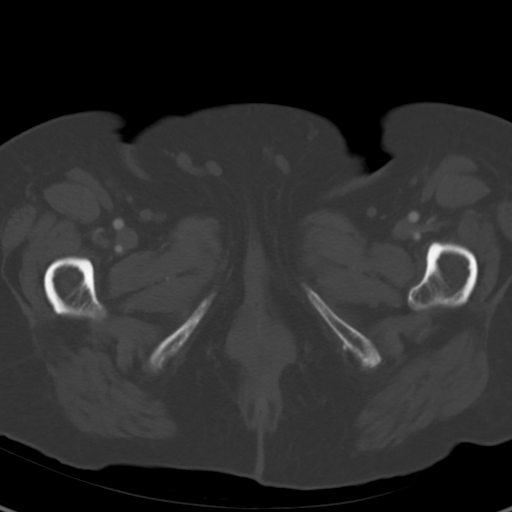
[im 20/113  soft-tissue]
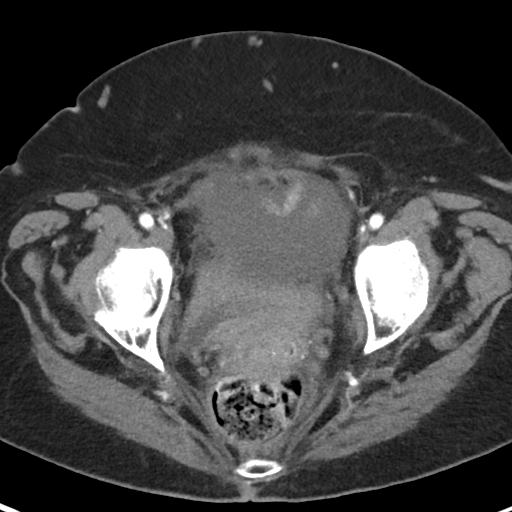
[im 33/113  soft-tissue]
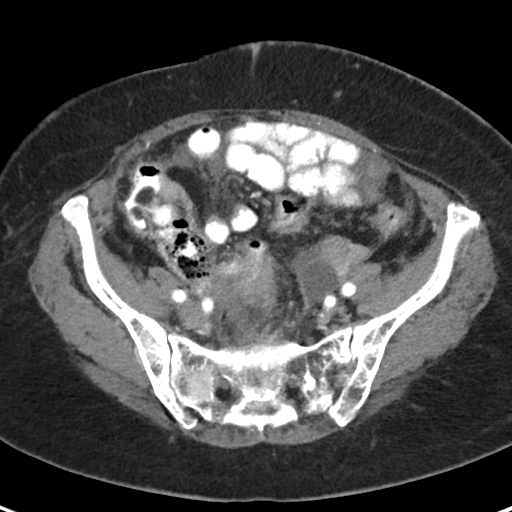
[im 47/113  soft-tissue]
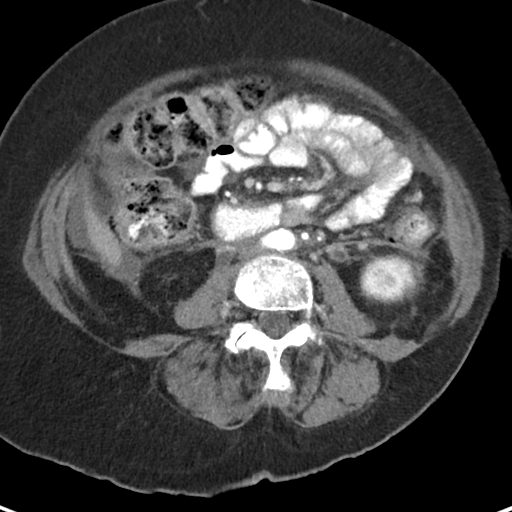
[im 60/113  soft-tissue]
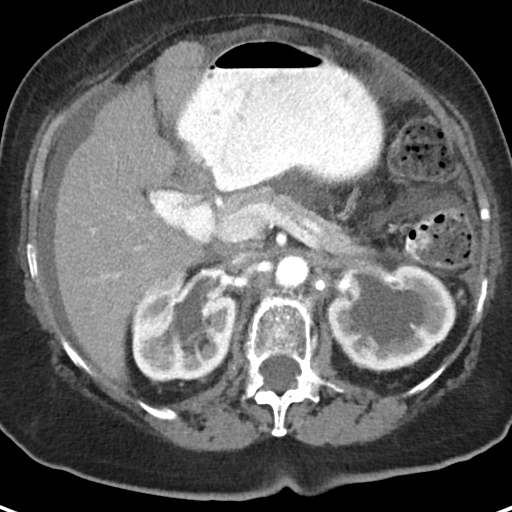
[im 66/113  soft-tissue]
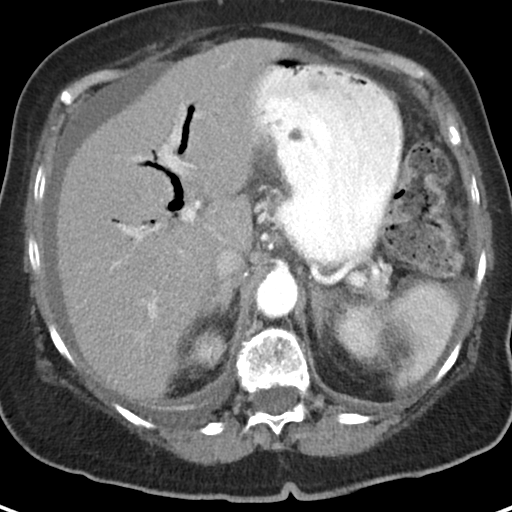
[im 80/113  soft-tissue]
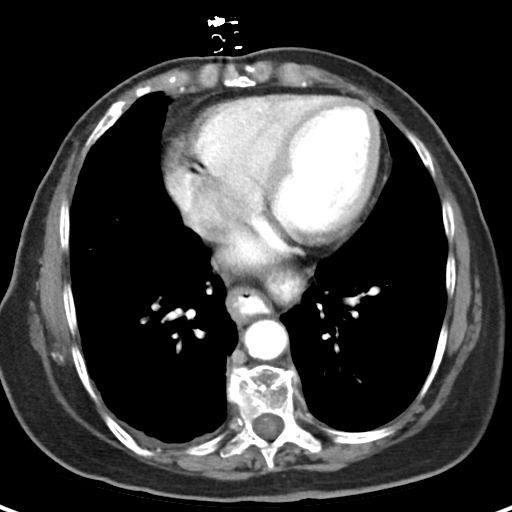
[im 93/113  soft-tissue]
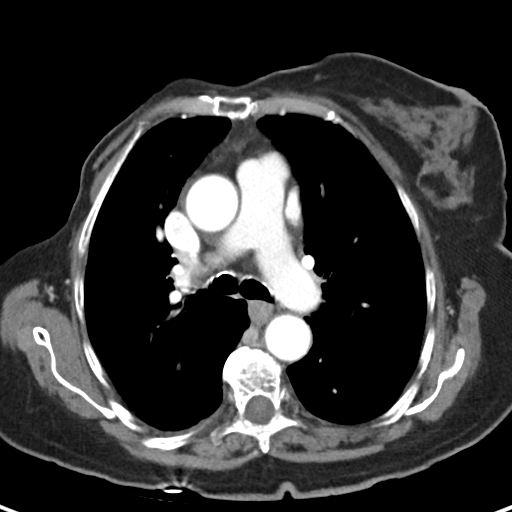
[im 106/113  soft-tissue]
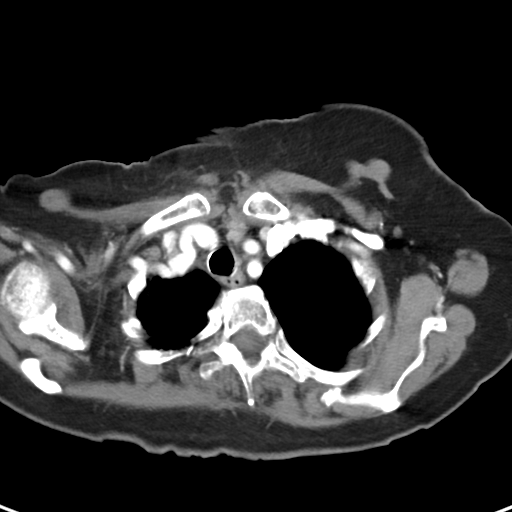
[im 106/113  bone]
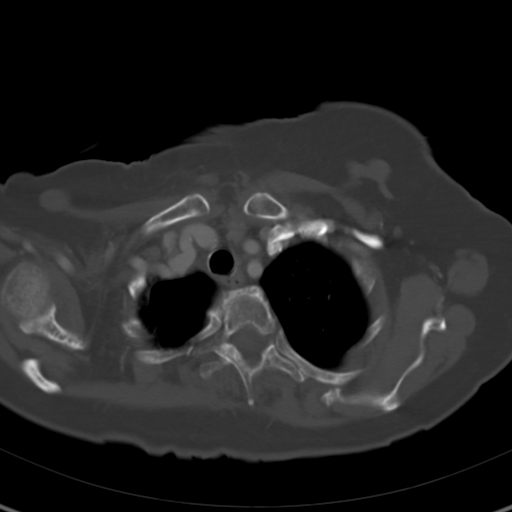

[Series 5: cor cap with · coronal · 0.61mm/px · 3 of 134 slices shown]
[im 45/134  soft-tissue]
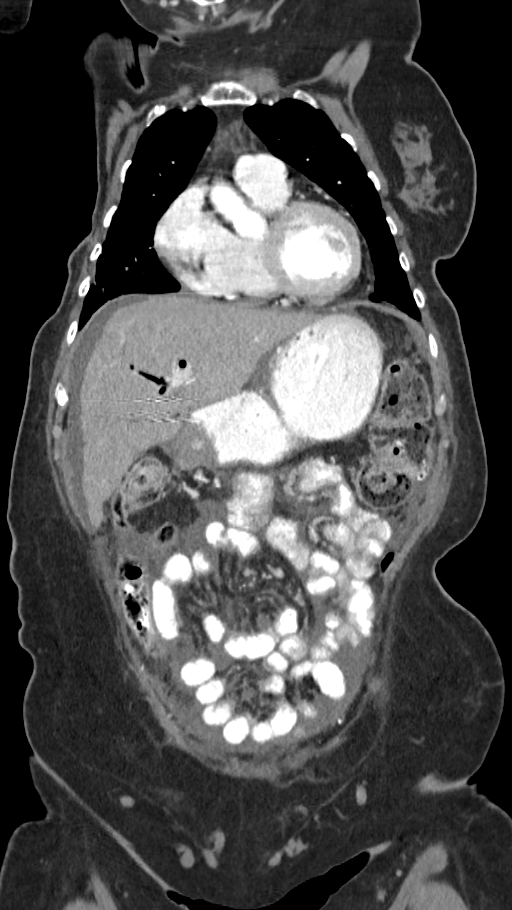
[im 60/134  soft-tissue]
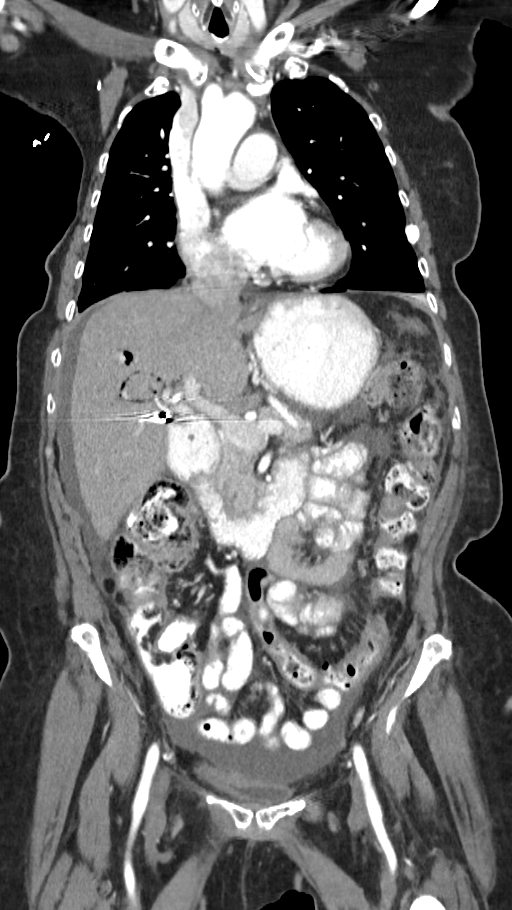
[im 74/134  soft-tissue]
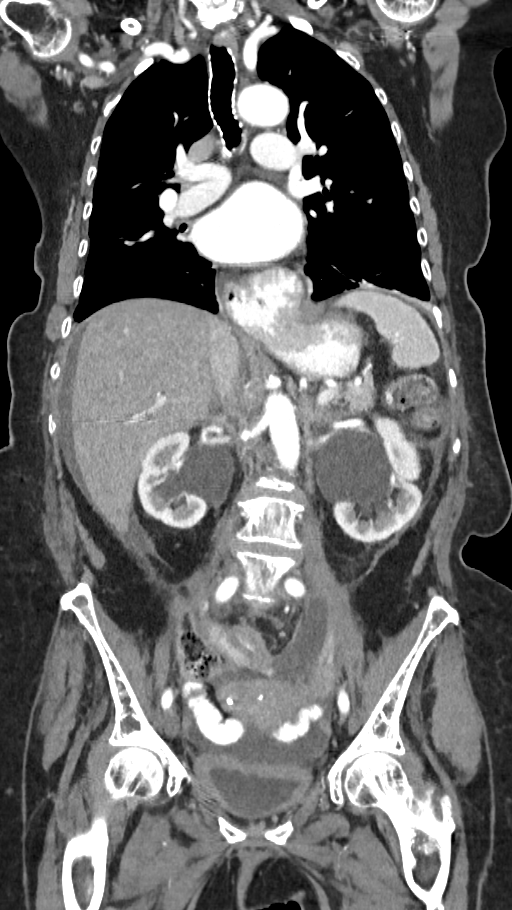

[12 of 46 positions shown; findings below may reference images not displayed]

FINDINGS: CT CHEST FINDINGS

Mediastinum/Nodes: Hypodense 9 mm left thyroid nodule.

Moderate cardiomegaly. Distal esophageal wall thickening, mild.
Small to moderate-sized hiatal hernia.

Small mediastinal lymph nodes are not pathologically enlarged. Right
mastectomy. Right subpectoral node 5 mm in short axis on image 13
series 2, previously 3 mm.

Lungs/Pleura: Trace right pleural effusion. Mild infiltration of the
right anterior pericardial adipose tissue, image 37 series 2.
Pleural thickening anteriorly adjacent to the right middle lobe,
images 37-39 series 2.

Mild scarring in the left lower lobe. 3 mm left upper lobe nodule,
image 14 series 4, no change from 8858, considered benign.

Musculoskeletal: Severe bony demineralization. Healing right
posterolateral ninth rib fracture, image 44 series 4. Healing
fracture of the left sixth rib the rib on image 30 series 4,
possibly pathologic. Extensive thoracic spondylosis and
demineralization. Lucency in the base of the T11 spinous process,
image 87 series 6. Lytic lesion in the right posterior vertebral
body and pedicle at T10, image 80 series 6.

CT ABDOMEN PELVIS FINDINGS

Hepatobiliary: Chronic pneumobilia. Lateral segment left hepatic
lobe cyst, image 45 series 2, chronic. Cholecystectomy. Equivocally
nodular liver contour.

Pancreas: Unremarkable

Spleen: 2.0 by 1.0 cm hypodense lesion medially in the dome of the
spleen, image 38 series 2.

Adrenals/Urinary Tract: Bilateral hydronephrosis with distended
collecting systems. Mild proximal hydroureter with more striking
left distal hydroureter. Poor definition of the ureters distally.
Wall thickening of the urinary bladder.

Stomach/Bowel: Wall thickening in the stomach antrum may be due to
contraction but is technically nonspecific. Mild prominence of stool
in the colon. Prominent wall thickening in the rectum extending from
the rectosigmoid junction to the lower rectum.

Vascular/Lymphatic: Mild aortoiliac atherosclerosis. Venous
varicosities along the lower abdominal wall and subcutaneous tissues
superficial to the pubis. Small external iliac lymph nodes are not
pathologically enlarged.

Reproductive: Amorphous soft tissue densities are present along both
adnexa. These are enlarged compared to 10/23/2009 and concerning for
tumor deposits. Considerable indistinctness of surrounding tissue
planes, tracking along the pelvic side walls. Calcifications in the
uterus likely from prior fibroids.

Other: Moderate scattered ascites. Fluid infiltration of the omentum
with some mutlifocality/irregularity. Fluid infiltration of the
mesentery. Tumor infiltration of the mesentery and omentum is a
distinct possibility. Presacral edema noted.

Musculoskeletal: Bony demineralization. This may obscure metastatic
lesions. Superior endplate compression fracture at L5 with grade 1
degenerative anterolisthesis at L5-S1. Vertical lucency along the
right side of the L4 vertebral body on images 78-79 of series 6
could be from a nondisplaced fracture or simply an irregular spur.
Old superior endplate compression fracture at L3. Multilevel
degenerative disc disease in the lumbar spine.
IMPRESSION: 1. Marked wall thickening in portions of the rectum such that rectal
tumor is not excluded. Increase in soft tissue density along the
adnexa/ovaries compared to prior, metastatic deposits in this
vicinity are not excluded. There is retroperitoneal, pelvic
sidewall, mesenteric, and omental edema with enhancing margins of
the ureters, and likely some degree of distal ureteral obstruction
causing the bilateral hydronephrosis and ureteral dilatation and
enhancement. Peritoneal spread of malignancy is not excluded given
this constellation of findings.
2. Osseous metastatic disease to the T10 vertebra, potentially the
T11 spinous process, and ribs.
3. Superior endplate compression fracture at L5. Possible
nondisplaced right-sided cortical fracture of the L4 vertebral body.
4. Equivocally nodular liver contour, query cirrhosis.
5. Additional findings at individual levels are as follows:
Cardiomegaly. Distal esophageal wall thickening, query esophagitis.
Small to moderate hiatal hernia. Trace right pleural effusion with
some anterior right pleural irregularity. Prominent bony
demineralization. Several healing rib fractures, 1 of which may be
pathologic. Chronic pneumobilia. 2.0 by 1.0 cm nonspecific hypodense
lesion medially in the dome of the spleen, merits observation.
Atherosclerosis. Venous varicosities along the lower abdominal wall
subcutaneous tissues. Multilevel degenerative disc disease.
